# Patient Record
Sex: Female | Born: 1990 | Race: Black or African American | Hispanic: No | Marital: Single | Smoking: Former smoker
Health system: Southern US, Community
[De-identification: ages and names within clinical notes are randomized; demographics above are authoritative.]

## PROBLEM LIST (undated history)

## (undated) DIAGNOSIS — D571 Sickle-cell disease without crisis: Secondary | ICD-10-CM

## (undated) DIAGNOSIS — G8929 Other chronic pain: Secondary | ICD-10-CM

## (undated) DIAGNOSIS — M2550 Pain in unspecified joint: Secondary | ICD-10-CM

## (undated) DIAGNOSIS — M87059 Idiopathic aseptic necrosis of unspecified femur: Secondary | ICD-10-CM

## (undated) DIAGNOSIS — N6320 Unspecified lump in the left breast, unspecified quadrant: Secondary | ICD-10-CM

## (undated) DIAGNOSIS — Z9289 Personal history of other medical treatment: Secondary | ICD-10-CM

## (undated) HISTORY — PX: CHOLECYSTECTOMY: SHX55

---
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
## 2011-07-14 ENCOUNTER — Emergency Department (HOSPITAL_COMMUNITY)
Admission: EM | Admit: 2011-07-14 | Discharge: 2011-07-14 | Disposition: A | Discharge status: Home / Self Care | Payer: Managed Care, Other (non HMO) | Attending: Emergency Medicine | Admitting: Emergency Medicine

## 2011-07-14 ENCOUNTER — Encounter (HOSPITAL_COMMUNITY): Payer: Self-pay | Admitting: *Deleted

## 2011-07-14 ENCOUNTER — Emergency Department (HOSPITAL_COMMUNITY): Payer: Managed Care, Other (non HMO)

## 2011-07-14 DIAGNOSIS — M25561 Pain in right knee: Secondary | ICD-10-CM

## 2011-07-14 DIAGNOSIS — M25569 Pain in unspecified knee: Secondary | ICD-10-CM | POA: Insufficient documentation

## 2011-07-14 DIAGNOSIS — S93409A Sprain of unspecified ligament of unspecified ankle, initial encounter: Secondary | ICD-10-CM | POA: Insufficient documentation

## 2011-07-14 DIAGNOSIS — M8708 Idiopathic aseptic necrosis of bone, other site: Secondary | ICD-10-CM | POA: Insufficient documentation

## 2011-07-14 DIAGNOSIS — W010XXA Fall on same level from slipping, tripping and stumbling without subsequent striking against object, initial encounter: Secondary | ICD-10-CM | POA: Insufficient documentation

## 2011-07-14 DIAGNOSIS — S93401A Sprain of unspecified ligament of right ankle, initial encounter: Secondary | ICD-10-CM

## 2011-07-14 DIAGNOSIS — D571 Sickle-cell disease without crisis: Secondary | ICD-10-CM | POA: Insufficient documentation

## 2011-07-14 HISTORY — DX: Idiopathic aseptic necrosis of unspecified femur: M87.059

## 2011-07-14 HISTORY — DX: Sickle-cell disease without crisis: D57.1

## 2011-07-14 MED ORDER — NAPROXEN 500 MG PO TABS: 500.0000 mg | ORAL_TABLET | Freq: Two times a day (BID) | ORAL | Status: AC

## 2011-07-14 NOTE — ED Provider Notes (Signed)
History     CSN: [id number]  Arrival date & time 07/14/11  1430   First MD Initiated Contact with Patient 07/14/11 1505      Chief Complaint  Patient presents with  . Knee Pain    right  . Ankle Pain    right    (Consider location/radiation/quality/duration/timing/severity/associated sxs/prior treatment) Patient is a 21 y.o. female presenting with fall. The history is provided by the patient.  Fall The accident occurred more than 2 days ago. The fall occurred while walking. She landed on concrete. The point of impact was the right knee (right ankle). The pain is present in the right knee (right ankle). The pain is at a severity of 5/10. The pain is mild. Pertinent negatives include no fever and no numbness. She has tried ice, NSAIDs, acetaminophen and elevation for the symptoms. The treatment provided no relief.  PT states state tripped about a week ago and twisted her right ankle. States it has been swollen. She has been icing it, ACE wrapping, tylenol for pain, with no improvement. States about 3 days after the fall, she developed right knee pain. States painful to walk, move her knee, and even sleep, if she moves it the wrong way. No other injuries. Pt is able to bear weight.   Past Medical History  Diagnosis Date  . Sickle cell disease   . Avascular necrosis of bone of hip     Left  . Acute chest syndrome due to sickle-cell disease   . Blood transfusion     Past Surgical History  Procedure Date  . Wisdom teeth removal   . Cholecystectomy     History reviewed. No pertinent family history.  History  Substance Use Topics  . Smoking status: Never Smoker   . Smokeless tobacco: Never Used  . Alcohol Use: Yes     social    OB History    Grav Para Term Preterm Abortions TAB SAB Ect Mult Living                  Review of Systems  Constitutional: Negative for fever and chills.  Respiratory: Negative.   Cardiovascular: Negative.   Gastrointestinal: Negative.     Musculoskeletal: Positive for joint swelling and arthralgias.  Skin: Negative.   Neurological: Negative for weakness and numbness.    Allergies  Review of patient's allergies indicates no known allergies.  Home Medications   Current Outpatient Rx  Name Route Sig Dispense Refill  . ACETAMINOPHEN 500 MG PO TABS Oral Take 500 mg by mouth every 6 (six) hours as needed. for pain relief    . LORATADINE 10 MG PO TABS Oral Take 10 mg by mouth daily.    . ADULT MULTIVITAMIN W/MINERALS CH Oral Take 1 tablet by mouth daily.      BP 121/80  Pulse 82  Temp(Src) 97.8 F (36.6 C) (Oral)  Resp 16  Wt 110 lb (49.896 kg)  SpO2 100%  LMP 07/14/2011  Physical Exam  Nursing note and vitals reviewed. Constitutional: She is oriented to person, place, and time. She appears well-developed and well-nourished.  HENT:  Head: Normocephalic.  Eyes: Conjunctivae are normal.  Neck: Neck supple.  Cardiovascular: Normal rate, regular rhythm and normal heart sounds.   Pulmonary/Chest: Effort normal and breath sounds normal. No respiratory distress.  Musculoskeletal:       Right ankle appears swollen over lateral malleolus, tender to palpation over lateral malleolus. Pain with flexion, extension, inversion and eversion. Joint stable. Achillis tendon  intact. Right knee tender over medial joint space. No tenderness over patella, quadricept tendon, or posterior knee. Pt unable to straighten knee all the way, states It catches, and painful, Full flexion, however painful. Patella tendon intact  Neurological: She is alert and oriented to person, place, and time.  Skin: Skin is warm and dry.  Psychiatric: She has a normal mood and affect.    ED Course  Procedures (including critical care time)  Pt with right ankle pain and rightknee pain after a fall 1 week ago. Will get x-rays. No obvious deformity or neurovascular compromise on exam.   Dg Ankle Complete Right  07/14/2011  *RADIOLOGY REPORT*  Clinical Data:  Right ankle pain.  No reported injury.  RIGHT ANKLE - COMPLETE 3+ VIEW  Comparison: None.  Findings: Mild lateral soft tissue swelling.  Normal appearing bones.  No effusion.  IMPRESSION: Mild lateral soft tissue swelling.  Normal appearing bones.  Original Report Authenticated By: Darrol Angel, M.D.   Dg Knee Complete 4 Views Right  07/14/2011  *RADIOLOGY REPORT*  Clinical Data: Increased medial right knee pain following a fall 1 week ago.  RIGHT KNEE - COMPLETE 4+ VIEW  Comparison: None.  Findings: 2.4 cm nonaggressive lucent lesion with peripheral sclerosis in the metaphysis and metadiaphysis region of the distal right femur, medially.  No fracture, dislocation or effusion.  IMPRESSION:  1.  No fracture. 2.  Distal femoral non-ossifying fibroma.  Original Report Authenticated By: Darrol Angel, M.D.   X-rays are normal. Pt provided with an ASO brace and a knee sleeve. Joints are both stable. Will d/c home with Ortho follow up as needed.     1. Right ankle sprain   2. Pain in right knee       MDM          Lottie Mussel, PA 07/14/11 1658

## 2011-07-14 NOTE — ED Notes (Signed)
Pt from home with reports of tripping over a curb about a week ago injuring right ankle that was swollen and painful which improved but 3 days ago right knee began to hurt. Pt denies new injury since fall.

## 2011-07-14 NOTE — ED Notes (Signed)
Pt in with c/o right knee pain states fell several days ago states pain with ambulation

## 2011-07-14 NOTE — Discharge Instructions (Signed)
Your ankle x-ray is normal. Your knee xray showed a fibroma, which I do not think is connected to your pain, no fractures or other abnormalities seen. Keep ASO splint on your ankle when walking around. Keep knee sleeve on. Follow up with orthopedics if not improving or worsening. Take naprosyn for pain and inflammation as prescribed.   Ankle Sprain An ankle sprain is an injury to the strong, fibrous tissues (ligaments) that hold the bones of your ankle joint together.  CAUSES Ankle sprain usually is caused by a fall or by twisting your ankle. People who participate in sports are more prone to these types of injuries.  SYMPTOMS  Symptoms of ankle sprain include:  Pain in your ankle. The pain may be present at rest or only when you are trying to stand or walk.   Swelling.   Bruising. Bruising may develop immediately or within 1 to 2 days after your injury.   Difficulty standing or walking.  DIAGNOSIS  Your caregiver will ask you details about your injury and perform a physical exam of your ankle to determine if you have an ankle sprain. During the physical exam, your caregiver will press and squeeze specific areas of your foot and ankle. Your caregiver will try to move your ankle in certain ways. An X-ray exam may be done to be sure a bone was not broken or a ligament did not separate from one of the bones in your ankle (avulsion).  TREATMENT  Certain types of braces can help stabilize your ankle. Your caregiver can make a recommendation for this. Your caregiver may recommend the use of medication for pain. If your sprain is severe, your caregiver may refer you to a surgeon who helps to restore function to parts of your skeletal system (orthopedist) or a physical therapist. HOME CARE INSTRUCTIONS  Apply ice to your injury for 1 to 2 days or as directed by your caregiver. Applying ice helps to reduce inflammation and pain.  Put ice in a plastic bag.   Place a towel between your skin and the  bag.   Leave the ice on for 15 to 20 minutes at a time, every 2 hours while you are awake.   Take over-the-counter or prescription medicines for pain, discomfort, or fever only as directed by your caregiver.   Keep your injured leg elevated, when possible, to lessen swelling.   If your caregiver recommends crutches, use them as instructed. Gradually, put weight on the affected ankle. Continue to use crutches or a cane until you can walk without feeling pain in your ankle.   If you have a plaster splint, wear the splint as directed by your caregiver. Do not rest it on anything harder than a pillow the first 24 hours. Do not put weight on it. Do not get it wet. You may take it off to take a shower or bath.   You may have been given an elastic bandage to wear around your ankle to provide support. If the elastic bandage is too tight (you have numbness or tingling in your foot or your foot becomes cold and blue), adjust the bandage to make it comfortable.   If you have an air splint, you may blow more air into it or let air out to make it more comfortable. You may take your splint off at night and before taking a shower or bath.   Wiggle your toes in the splint several times per day if you are able.  SEEK MEDICAL CARE  IF:   You have an increase in bruising, swelling, or pain.   Your toes feel cold.   Pain relief is not achieved with medication.  SEEK IMMEDIATE MEDICAL CARE IF: Your toes are numb or blue or you have severe pain. MAKE SURE YOU:   Understand these instructions.   Will watch your condition.   Will get help right away if you are not doing well or get worse.  Document Released: 03/04/2005 Document Revised: 02/21/2011 Document Reviewed: 10/07/2007 [facility] Patient Information 2012 [place], [place].  Knee Pain The knee is the complex joint between your thigh and your lower leg. It is made up of bones, tendons, ligaments, and cartilage. The bones that make up the knee  are:  The femur in the thigh.   The tibia and fibula in the lower leg.   The patella or kneecap riding in the groove on the lower femur.  CAUSES  Knee pain is a common complaint with many causes. A few of these causes are:  Injury, such as:   A ruptured ligament or tendon injury.   Torn cartilage.   Medical conditions, such as:   Gout   Arthritis   Infections   Overuse, over training or overdoing a physical activity.  Knee pain can be minor or severe. Knee pain can accompany debilitating injury. Minor knee problems often respond well to self-care measures or get well on their own. More serious injuries may need medical intervention or even surgery. SYMPTOMS The knee is complex. Symptoms of knee problems can vary widely. Some of the problems are:  Pain with movement and weight bearing.   Swelling and tenderness.   Buckling of the knee.   Inability to straighten or extend your knee.   Your knee locks and you cannot straighten it.   Warmth and redness with pain and fever.   Deformity or dislocation of the kneecap.  DIAGNOSIS  Determining what is wrong may be very straight forward such as when there is an injury. It can also be challenging because of the complexity of the knee. Tests to make a diagnosis may include:  Your caregiver taking a history and doing a physical exam.   Routine X-rays can be used to rule out other problems. X-rays will not reveal a cartilage tear. Some injuries of the knee can be diagnosed by:   Arthroscopy a surgical technique by which a small video camera is inserted through tiny incisions on the sides of the knee. This procedure is used to examine and repair internal knee joint problems. Tiny instruments can be used during arthroscopy to repair the torn knee cartilage (meniscus).   Arthrography is a radiology technique. A contrast liquid is directly injected into the knee joint. Internal structures of the knee joint then become visible on  X-ray film.   An MRI scan is a non x-ray radiology procedure in which magnetic fields and a computer produce two- or three-dimensional images of the inside of the knee. Cartilage tears are often visible using an MRI scanner. MRI scans have largely replaced arthrography in diagnosing cartilage tears of the knee.   Blood work.   Examination of the fluid that helps to lubricate the knee joint (synovial fluid). This is done by taking a sample out using a needle and a syringe.  TREATMENT The treatment of knee problems depends on the cause. Some of these treatments are:  Depending on the injury, proper casting, splinting, surgery or physical therapy care will be needed.   Give yourself  adequate recovery time. Do not overuse your joints. If you begin to get sore during workout routines, back off. Slow down or do fewer repetitions.   For repetitive activities such as cycling or running, maintain your strength and nutrition.   Alternate muscle groups. For example if you are a weight lifter, work the upper body on one day and the lower body the next.   Either tight or weak muscles do not give the proper support for your knee. Tight or weak muscles do not absorb the stress placed on the knee joint. Keep the muscles surrounding the knee strong.   Take care of mechanical problems.   If you have flat feet, orthotics or special shoes may help. See your caregiver if you need help.   Arch supports, sometimes with wedges on the inner or outer aspect of the heel, can help. These can shift pressure away from the side of the knee most bothered by osteoarthritis.   A brace called an "unloader" brace also may be used to help ease the pressure on the most arthritic side of the knee.   If your caregiver has prescribed crutches, braces, wraps or ice, use as directed. The acronym for this is PRICE. This means protection, rest, ice, compression and elevation.   Nonsteroidal anti-inflammatory drugs (NSAID's), can  help relieve pain. But if taken immediately after an injury, they may actually increase swelling. Take NSAID's with food in your stomach. Stop them if you develop stomach problems. Do not take these if you have a history of ulcers, stomach pain or bleeding from the bowel. Do not take without your caregiver's approval if you have problems with fluid retention, heart failure, or kidney problems.   For ongoing knee problems, physical therapy may be helpful.   Glucosamine and chondroitin are over-the-counter dietary supplements. Both may help relieve the pain of osteoarthritis in the knee. These medicines are different from the usual anti-inflammatory drugs. Glucosamine may decrease the rate of cartilage destruction.   Injections of a corticosteroid drug into your knee joint may help reduce the symptoms of an arthritis flare-up. They may provide pain relief that lasts a few months. You may have to wait a few months between injections. The injections do have a small increased risk of infection, water retention and elevated blood sugar levels.   Hyaluronic acid injected into damaged joints may ease pain and provide lubrication. These injections may work by reducing inflammation. A series of shots may give relief for as long as 6 months.   Topical painkillers. Applying certain ointments to your skin may help relieve the pain and stiffness of osteoarthritis. Ask your pharmacist for suggestions. Many over the-counter products are approved for temporary relief of arthritis pain.   In some countries, doctors often prescribe topical NSAID's for relief of chronic conditions such as arthritis and tendinitis. A review of treatment with NSAID creams found that they worked as well as oral medications but without the serious side effects.  PREVENTION  Maintain a healthy weight. Extra pounds put more strain on your joints.   Get strong, stay limber. Weak muscles are a common cause of knee injuries. Stretching is  important. Include flexibility exercises in your workouts.   Be smart about exercise. If you have osteoarthritis, chronic knee pain or recurring injuries, you may need to change the way you exercise. This does not mean you have to stop being active. If your knees ache after jogging or playing basketball, consider switching to swimming, water aerobics or other  low-impact activities, at least for a few days a week. Sometimes limiting high-impact activities will provide relief.   Make sure your shoes fit well. Choose footwear that is right for your sport.   Protect your knees. Use the proper gear for knee-sensitive activities. Use kneepads when playing volleyball or laying carpet. Buckle your seat belt every time you drive. Most shattered kneecaps occur in car accidents.   Rest when you are tired.  SEEK MEDICAL CARE IF:  You have knee pain that is continual and does not seem to be getting better.  SEEK IMMEDIATE MEDICAL CARE IF:  Your knee joint feels hot to the touch and you have a high fever. MAKE SURE YOU:   Understand these instructions.   Will watch your condition.   Will get help right away if you are not doing well or get worse.  Document Released: 12/30/2006 Document Revised: 02/21/2011 Document Reviewed: 12/30/2006 [facility] Patient Information 2012 [place], [place].

## 2011-07-14 NOTE — ED Provider Notes (Signed)
Medical screening examination/treatment/procedure(s) were performed by non-physician practitioner and as supervising physician I was immediately available for consultation/collaboration.   Lyanne Co, MD 07/14/11 [phone]

## 2012-09-02 IMAGING — CR DG ANKLE COMPLETE 3+V*R*
3 series · 3 of 3 positions shown · non-contrast
Comparison: None.

CLINICAL DATA: Right ankle pain.  No reported injury.

RIGHT ANKLE - COMPLETE 3+ VIEW

[x ankle ap right]
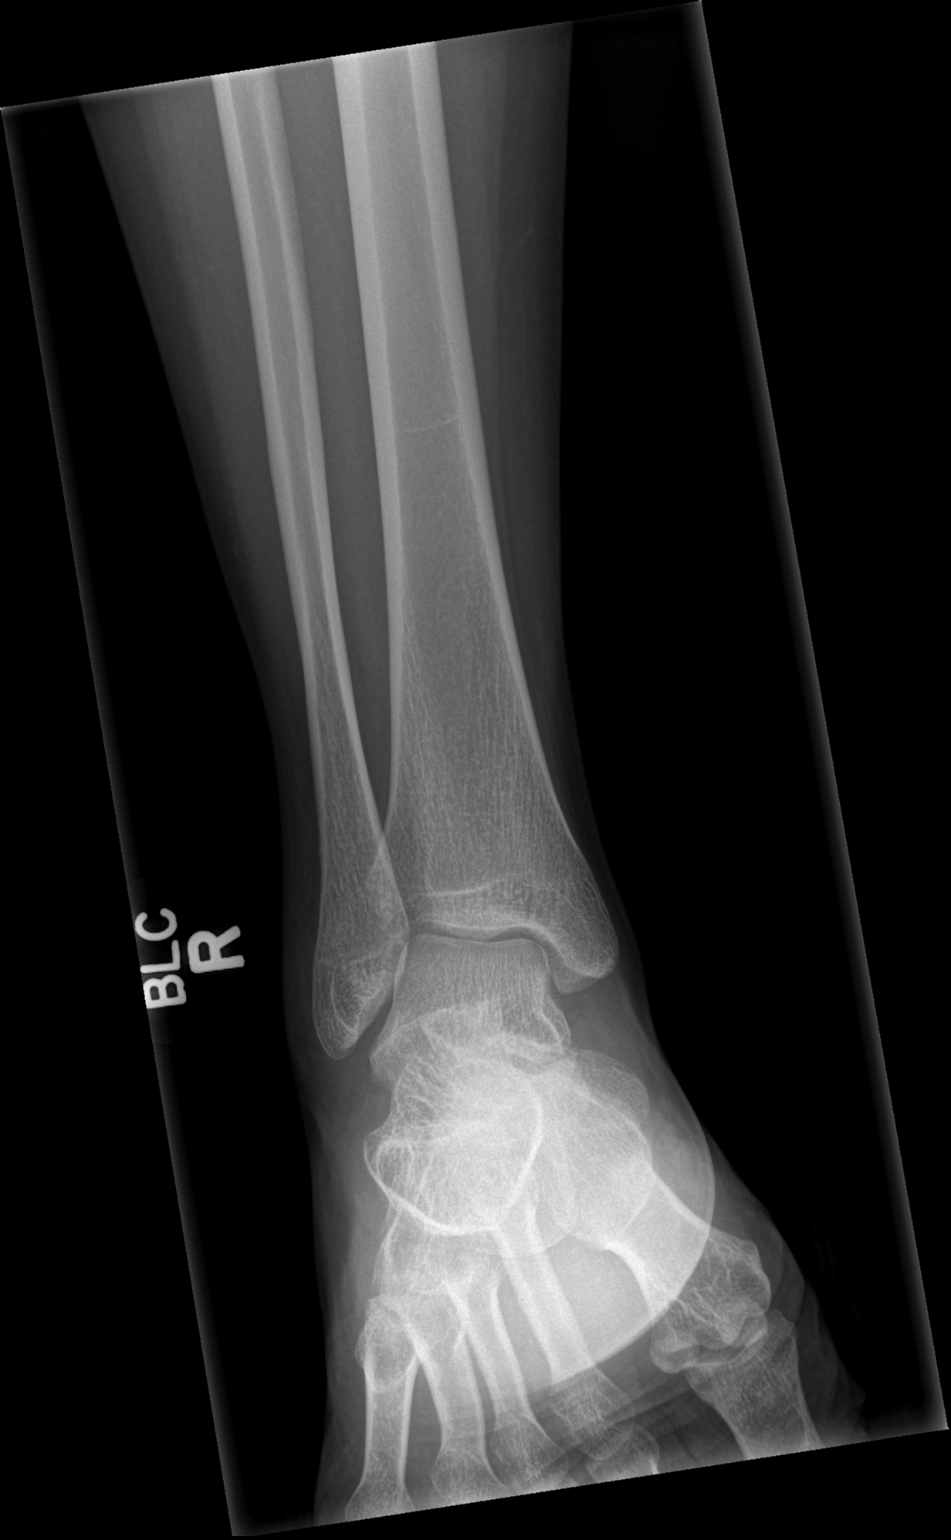

[x ankle obl right]
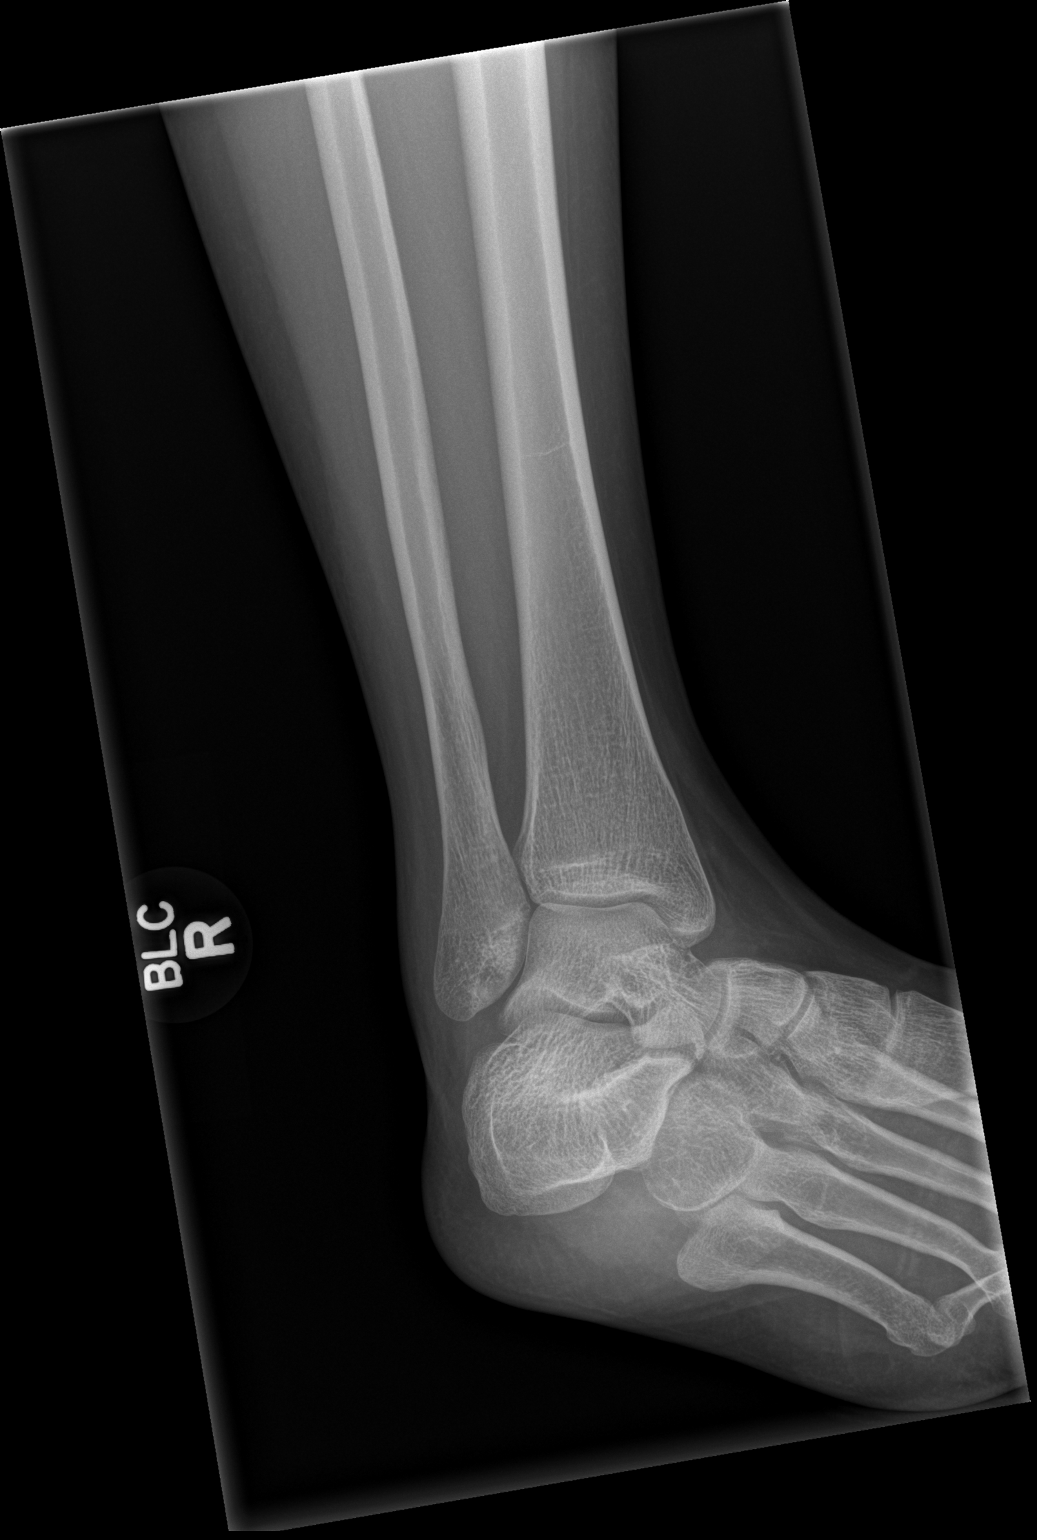

[x ankle lat right]
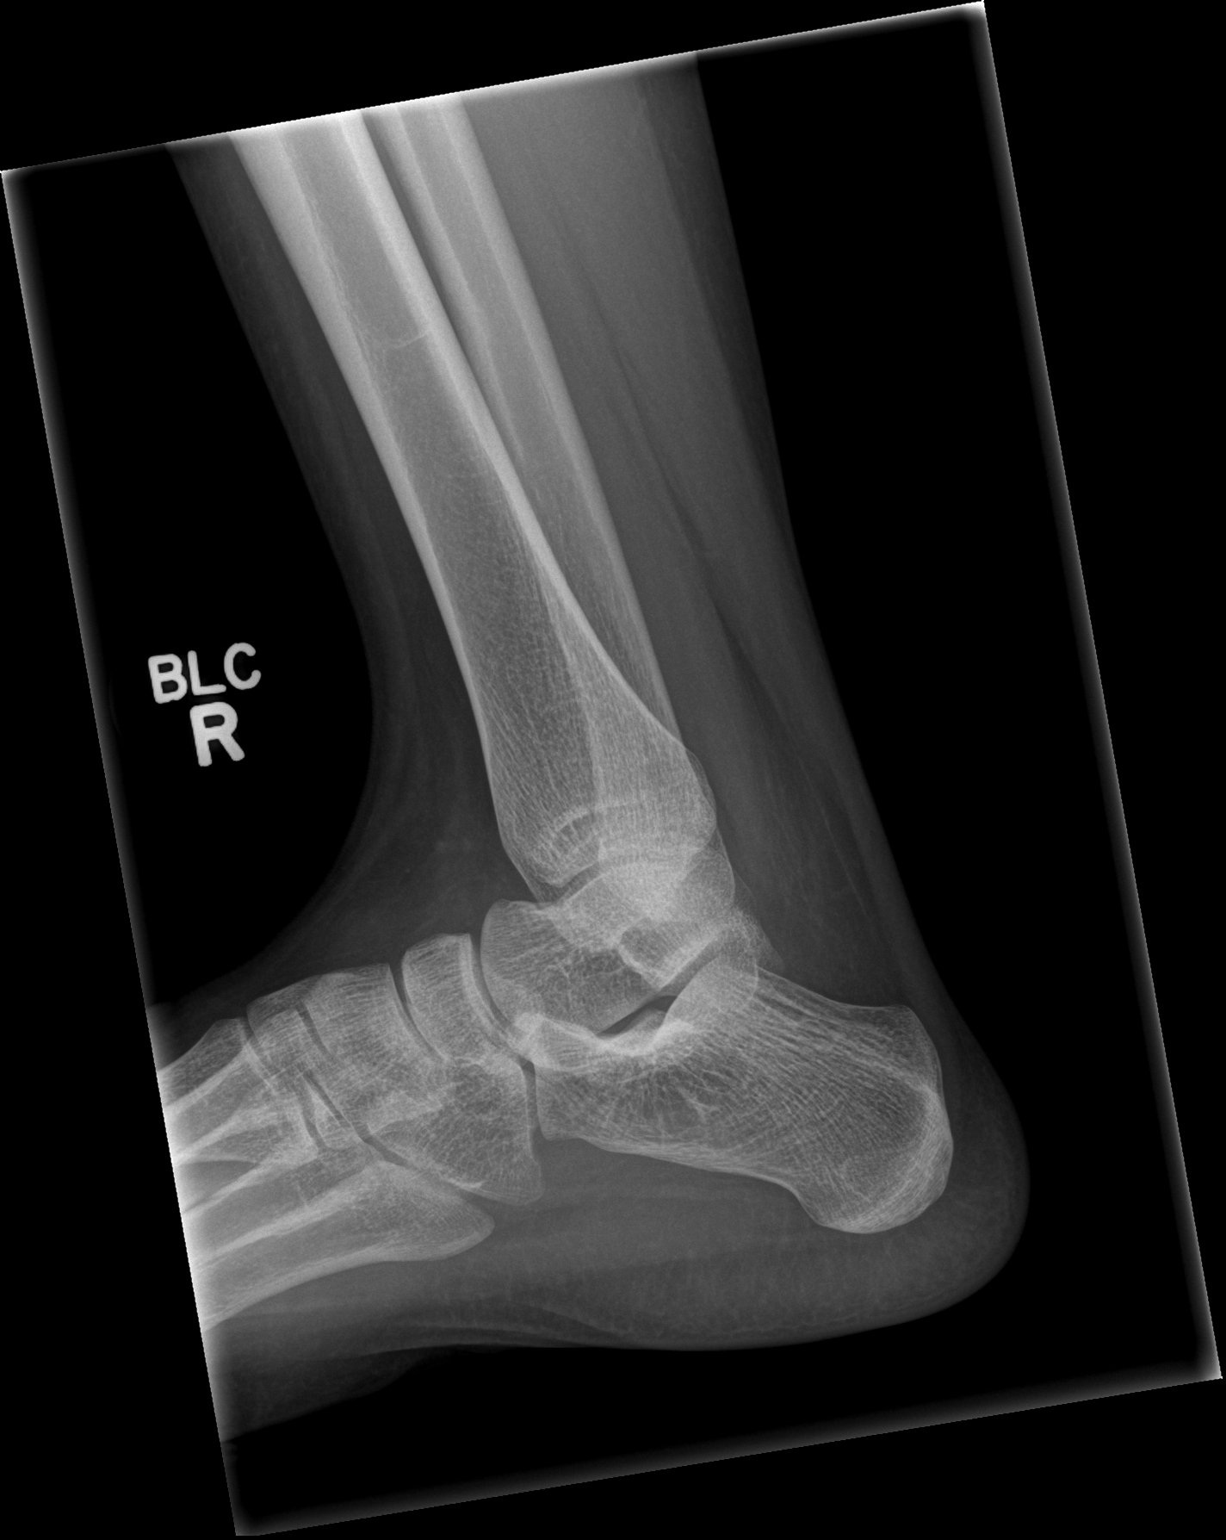

[3 of 3 positions shown; findings below may reference images not displayed]

FINDINGS: Mild lateral soft tissue swelling.  Normal appearing
bones.  No effusion.
IMPRESSION: Mild lateral soft tissue swelling.  Normal appearing bones.

## 2013-03-18 HISTORY — PX: VITRECTOMY: SHX106

## 2013-11-13 ENCOUNTER — Ambulatory Visit (INDEPENDENT_AMBULATORY_CARE_PROVIDER_SITE_OTHER): Payer: Self-pay | Admitting: Internal Medicine

## 2013-11-13 VITALS — BP 118/76 | HR 103 | Temp 98.2°F | Resp 16 | Ht 63.0 in | Wt 119.6 lb

## 2013-11-13 DIAGNOSIS — F410 Panic disorder [episodic paroxysmal anxiety] without agoraphobia: Secondary | ICD-10-CM

## 2013-11-13 MED ORDER — ALPRAZOLAM 0.25 MG PO TABS: 0.2500 mg | ORAL_TABLET | Freq: Two times a day (BID) | ORAL | Status: DC | PRN

## 2013-11-13 MED ORDER — ESCITALOPRAM OXALATE 10 MG PO TABS: 10.0000 mg | ORAL_TABLET | Freq: Every day | ORAL | Status: DC

## 2013-11-13 NOTE — Progress Notes (Signed)
Subjective:  This chart was scribed for Tami Lin, MD by Donato Schultz, Medical Scribe. This patient was seen in Room 5 and the patient's care was started at 10:15 AM.   Patient ID: Sabrina Short, female    DOB: 10-Feb-1991, 23 y.o.   MRN: [id number]  HPI HPI Comments: Sabrina Short is a 23 y.o. female who presents to the [facility] complaining of frequent panic attacks with associated chest pain that have progressively worsened over the past 2 years.  She first started experiencing panic attacks when she was a child but they eventually subsided.  They returned 7 years later when she was a senior in high school and has been suffering from them ever since.  She was evaluated for her symptoms when she was in elementary school and diagnosed her with acute chest syndrome.  Her panic attacks will last for 30-50 minutes depending on their severity.  She has had more than one panic attack over the past couple of weeks.  She lists palpitations and SOB as associated symptoms.  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  She states that schools stresses her out but does not cause a panic attack.  There is nothing in her social life that is causing her stress.  She has trouble falling asleep and will think about things she has to do the next day.  She does not have any problems with cleanliness, fears, or obsessive thoughts.  She has not had any significant weight loss within the last 3 months but when she has a crisis she will lose a little bit of weight.  She gets depressed at the thought of having another panic attack but it does not interfere with her ability to perform in school.  She has been treated with Zoloft but stopped taking the medication because it made her feel on edge.  She has never used any medication to immediately treat her symptoms.  She tends to stay away from romantic relationships to focus on  herself.  She does not consume much alcohol and has never used marijuana or cocaine.    She has a history of sickle cell disease and her last crisis was on August 3.  She will self-medicate with Ibuprofen if it is a small crisis but if it is a more severe one she will use Ibuprofen and Hydrocodone every 6 hours.    Her grandfather has a history of depression but nobody else in her family has a history of anxiety attacks.     Past Medical History  Diagnosis Date  . Sickle cell disease   . Avascular necrosis of bone of hip     Left  . Acute chest syndrome due to sickle-cell disease   . Blood transfusion    Past Surgical History  Procedure Laterality Date  . Wisdom teeth removal    . Cholecystectomy     History reviewed. No pertinent family history. History   Social History  . Marital Status: Single    Spouse Name: N/A    Number of Children: N/A  . Years of Education: N/A   Occupational History  . Not on file.   Social History Main Topics  . Smoking status: Never Smoker   . Smokeless tobacco: Never Used  . Alcohol Use: Yes     Comment: social  . Drug Use: Yes    Special: Marijuana  Comment: occ  . Sexual Activity:    Other Topics Concern  . Not on file   Social History Narrative  . No narrative on file   Allergies  Allergen Reactions  . Morphine And Related     Review of Systems  Cardiovascular: Positive for chest pain and palpitations.   no headaches No confusion No weight loss   Objective:  Physical Exam  Nursing note and vitals reviewed. Constitutional: She is oriented to person, place, and time. She appears well-developed and well-nourished. No distress.  HENT:  Head: Normocephalic and atraumatic.  Eyes: Conjunctivae and EOM are normal. Pupils are equal, round, and reactive to light.  Neck: Neck supple. Carotid bruit is not present. No thyromegaly present.  Cardiovascular: Regular rhythm and normal heart sounds.  Bradycardia present.  Exam  reveals no gallop and no friction rub.   No murmur heard. Pulmonary/Chest: Effort normal and breath sounds normal. No respiratory distress. She has no wheezes. She has no rales.  Lymphadenopathy:    She has no cervical adenopathy.  Neurological: She is alert and oriented to person, place, and time. No cranial nerve deficit.  Psychiatric: She has a normal mood and affect. Her behavior is normal.     BP 118/76  Pulse 103  Temp(Src) 98.2 F (36.8 C) (Oral)  Resp 16  Ht 5\' 3"  (1.6 m)  Wt 119 lb 9.6 oz (54.25 kg)  BMI 21.19 kg/m2  SpO2 100%  LMP 10/18/2013 Assessment & Plan:  GAD--- start Lexapro/use Xanax for panic attacks for now/followup 3 weeks appointment  Meds ordered this encounter  Medications  . ALPRAZolam (XANAX) 0.25 MG tablet    Sig: Take 1 tablet (0.25 mg total) by mouth 2 (two) times daily as needed for anxiety.    Dispense:  60 tablet    Refill:  0  . escitalopram (LEXAPRO) 10 MG tablet    Sig: Take 1 tablet (10 mg total) by mouth daily.    Dispense:  30 tablet    Refill:  0    I personally performed the services described in this documentation, which was scribed in my presence. The recorded information has been reviewed and is accurate.

## 2013-11-15 DIAGNOSIS — F410 Panic disorder [episodic paroxysmal anxiety] without agoraphobia: Secondary | ICD-10-CM | POA: Insufficient documentation

## 2014-12-20 ENCOUNTER — Encounter: Payer: Self-pay | Admitting: Family Medicine

## 2014-12-20 ENCOUNTER — Ambulatory Visit (INDEPENDENT_AMBULATORY_CARE_PROVIDER_SITE_OTHER): Payer: Managed Care, Other (non HMO) | Admitting: Family Medicine

## 2014-12-20 VITALS — BP 113/82 | HR 100 | Temp 98.7°F | Resp 14 | Ht 63.0 in | Wt 117.0 lb

## 2014-12-20 DIAGNOSIS — M25512 Pain in left shoulder: Secondary | ICD-10-CM | POA: Diagnosis not present

## 2014-12-20 DIAGNOSIS — M8700 Idiopathic aseptic necrosis of unspecified bone: Secondary | ICD-10-CM | POA: Insufficient documentation

## 2014-12-20 DIAGNOSIS — D571 Sickle-cell disease without crisis: Secondary | ICD-10-CM | POA: Diagnosis not present

## 2014-12-20 LAB — COMPLETE METABOLIC PANEL WITH GFR
ALBUMIN: 4.7 g/dL (ref 3.6–5.1)
ALK PHOS: 49 U/L (ref 33–115)
ALT: 15 U/L (ref 6–29)
AST: 22 U/L (ref 10–30)
BILIRUBIN TOTAL: 1.1 mg/dL (ref 0.2–1.2)
BUN: 7 mg/dL (ref 7–25)
CALCIUM: 10.2 mg/dL (ref 8.6–10.2)
CO2: 27 mmol/L (ref 20–31)
CREATININE: 0.65 mg/dL (ref 0.50–1.10)
Chloride: 101 mmol/L (ref 98–110)
Glucose, Bld: 77 mg/dL (ref 65–99)
Potassium: 4.4 mmol/L (ref 3.5–5.3)
Sodium: 137 mmol/L (ref 135–146)
TOTAL PROTEIN: 8.1 g/dL (ref 6.1–8.1)

## 2014-12-20 LAB — POCT URINALYSIS DIP (DEVICE)
Bilirubin Urine: NEGATIVE
GLUCOSE, UA: NEGATIVE mg/dL
Hgb urine dipstick: NEGATIVE
Ketones, ur: NEGATIVE mg/dL
LEUKOCYTES UA: NEGATIVE
NITRITE: NEGATIVE
PROTEIN: NEGATIVE mg/dL
SPECIFIC GRAVITY, URINE: 1.02 (ref 1.005–1.030)
UROBILINOGEN UA: 0.2 mg/dL (ref 0.0–1.0)
pH: 7 (ref 5.0–8.0)

## 2014-12-20 LAB — CBC WITH DIFFERENTIAL/PLATELET
BASOS ABS: 0.1 10*3/uL (ref 0.0–0.1)
Basophils Relative: 1 % (ref 0–1)
EOS ABS: 0.1 10*3/uL (ref 0.0–0.7)
Eosinophils Relative: 1 % (ref 0–5)
HEMATOCRIT: 35.7 % — AB (ref 36.0–46.0)
HEMOGLOBIN: 12 g/dL (ref 12.0–15.0)
LYMPHS ABS: 1.7 10*3/uL (ref 0.7–4.0)
LYMPHS PCT: 27 % (ref 12–46)
MCH: 25.8 pg — AB (ref 26.0–34.0)
MCHC: 33.6 g/dL (ref 30.0–36.0)
MCV: 76.6 fL — AB (ref 78.0–100.0)
MONOS PCT: 18 % — AB (ref 3–12)
MPV: 8.9 fL (ref 8.6–12.4)
Monocytes Absolute: 1.1 10*3/uL — ABNORMAL HIGH (ref 0.1–1.0)
NEUTROS PCT: 53 % (ref 43–77)
Neutro Abs: 3.3 10*3/uL (ref 1.7–7.7)
PLATELETS: 404 10*3/uL — AB (ref 150–400)
RBC: 4.66 MIL/uL (ref 3.87–5.11)
RDW: 18.2 % — ABNORMAL HIGH (ref 11.5–15.5)
WBC: 6.3 10*3/uL (ref 4.0–10.5)

## 2014-12-20 LAB — LACTATE DEHYDROGENASE: LDH: 143 U/L (ref 94–250)

## 2014-12-20 LAB — FERRITIN: FERRITIN: 55 ng/mL (ref 10–291)

## 2014-12-20 MED ORDER — FOLIC ACID 1 MG PO TABS: 1.0000 mg | ORAL_TABLET | Freq: Every day | ORAL | Status: AC

## 2014-12-20 MED ORDER — IBUPROFEN 600 MG PO TABS: 600.0000 mg | ORAL_TABLET | Freq: Four times a day (QID) | ORAL | Status: DC | PRN

## 2014-12-20 MED ORDER — OXYCODONE HCL 5 MG PO TABS: 5.0000 mg | ORAL_TABLET | ORAL | Status: DC | PRN

## 2014-12-20 NOTE — Progress Notes (Signed)
Subjective:    Patient ID: Sabrina Short, female    DOB: 03-02-1991, 24 y.o.   MRN: [id number]  HPI Sabrina Short, a student with a history of sickle cell anemia, HbSC. She is currently a Ship broker at the [employer] and states that she has been followed by [facility]. She states that it has been difficult to go to follow-up appointments. She is currently complaining of left shoulder pain. She states that pain intensity is currently 3-4/10 described as intermittent and aching. She states that pain is increased by activity. She states that she has been taking Tylenol 500 mg as needed since running out of Oxycodone 5 mg 1 week ago. She states that she only uses opiate medications as a last resort. She has not had a prescription since July 2016.   Past Medical History  Diagnosis Date  . Sickle cell disease ([place])   . 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 Acute chest syndrome due to sickle-cell disease ([place])   . Blood transfusion    There is no immunization history on file for this patient.  Allergies  Allergen Reactions  . Morphine And Related    Social History   Social History  . Marital Status: Single    Spouse Name: N/A  . Number of Children: N/A  . Years of Education: N/A   Occupational History  . Not on file.   Social History Main Topics  . Smoking status: Former Research scientist (life sciences)  . Smokeless tobacco: Never Used  . Alcohol Use: Yes     Comment: social  . Drug Use: No     Comment: occ  . Sexual Activity: Not on file   Other Topics Concern  . Not on file   Social History Narrative   Review of Systems  Constitutional: Negative.   HENT: Negative.   Eyes: Negative.   Respiratory: Negative.   Cardiovascular: Negative.   Gastrointestinal: Negative.   Endocrine: Negative for polydipsia, polyphagia and polyuria.  Genitourinary: Negative.   Musculoskeletal: Positive for myalgias (left shoulder pain).  Skin: Negative.   Allergic/Immunologic:  Negative for environmental allergies and food allergies.  Neurological: Negative.  Negative for dizziness and headaches.  Hematological: Negative.   Psychiatric/Behavioral: Negative.        Objective:   Physical Exam  Constitutional: She is oriented to person, place, and time. She appears well-developed and well-nourished.  HENT:  Head: Normocephalic and atraumatic.  Right Ear: External ear normal.  Left Ear: External ear normal.  Mouth/Throat: Oropharynx is clear and moist.  Eyes: Conjunctivae and EOM are normal. Pupils are equal, round, and reactive to light.  Cardiovascular: Normal rate, regular rhythm, normal heart sounds and intact distal pulses.   Pulmonary/Chest: Effort normal and breath sounds normal.  Abdominal: Soft. Bowel sounds are normal.  Musculoskeletal: Normal range of motion.  Neurological: She is alert and oriented to person, place, and time. She has normal reflexes.  Skin: Skin is warm and dry.  Psychiatric: She has a normal mood and affect. Her behavior is normal. Judgment and thought content normal.      BP 113/82 mmHg  Pulse 100  Temp(Src) 98.7 F (37.1 C) (Oral)  Resp 14  Ht 5\' 3"  (1.6 m)  Wt 117 lb (53.071 kg)  BMI 20.73 kg/m2  LMP  Assessment & Plan:  1. Hb-SS disease without crisis ([place]) Sickle cell disease - The patient was reminded of the need to seek medical attention of  any symptoms of bleeding, anemia, or infection. Continue folic acid 1 mg daily to prevent aplastic bone marrow crises.    Pulmonary evaluation - Patient denies severe recurrent wheezes, shortness of breath with exercise, or persistent cough. If these symptoms develop, pulmonary function tests with spirometry will be ordered, and if abnormal, plan on referral to Pulmonology for further evaluation.  Cardiac - Routine screening for pulmonary hypertension is not recommended.  Eye - High risk of proliferative retinopathy. Annual eye exam with retinal exam recommended to patient. Last  eye examination was 6 months ago  Immunization status - She declines vaccines today. Patient states that she cannot tolerate influenza vaccination because it triggers sickle cell crisis. She states that she will bring vaccination records to follow-up appointment.   Acute and chronic painful episodes - We agreed on Oxycodone 5 mg every 4 hours for severe pain. We discussed that pt is to receive her Schedule II prescriptions only from [place]. Pt is also aware that the prescription history is available to [place] online through the [facility] CSRS. Controlled substance agreement signed 12/20/2014. We reminded Ms. Baldonado that all patients receiving Schedule II narcotics must be seen for follow within one month of prescription being requested. We reviewed the terms of our pain agreement, including the need to keep medicines in a safe locked location away from children or pets, and the need to report excess sedation or constipation, measures to avoid constipation, and policies related to early refills and stolen prescriptions. According to the [place] Chronic Pain Initiative program, we have reviewed details related to analgesia, adverse effects, aberrant behaviors. Reviewed [place] Substance Reporting system prior to reorder.     Iron overload from chronic transfusion.  Will check ferritin level   Vitamin D deficiency - Drisdol 50,000 units weekly, we encouraged her to take it.  - folic acid (FOLVITE) 1 MG tablet; Take 1 tablet (1 mg total) by mouth daily.  Dispense: 30 tablet; Refill: 11 - oxyCODONE (OXY IR/ROXICODONE) 5 MG immediate release tablet; Take 1 tablet (5 mg total) by mouth every 4 (four) hours as needed for severe pain.  Dispense: 30 tablet; Refill: 0 - ibuprofen (ADVIL,MOTRIN) 600 MG tablet; Take 1 tablet (600 mg total) by mouth every 6 (six) hours as needed.  Dispense: 30 tablet; Refill: 1 - EKG 12-Lead - Lactate Dehydrogenase - CBC with Differential - COMPLETE METABOLIC PANEL WITH GFR - Ferritin - POCT urinalysis  dipstick  2. Left shoulder pain  - DG Shoulder Left; Future   RTC: 1 month

## 2014-12-20 NOTE — Patient Instructions (Signed)
Sickle Cell Anemia, Adult °Sickle cell anemia is a condition in which red blood cells have an abnormal "sickle" shape. This abnormal shape shortens the cells' life span, which results in a lower than normal concentration of red blood cells in the blood. The sickle shape also causes the cells to clump together and block free blood flow through the blood vessels. As a result, the tissues and organs of the body do not receive enough oxygen. Sickle cell anemia causes organ damage and pain and increases the risk of infection. °CAUSES  °Sickle cell anemia is a genetic disorder. Those who receive two copies of the gene have the condition, and those who receive one copy have the trait. °RISK FACTORS °The sickle cell gene is most common in people whose families originated in [place]. Other areas of the globe where sickle cell trait occurs include the Mediterranean, South and [place], the [place], and the [place].  °SIGNS AND SYMPTOMS °· Pain, especially in the extremities, back, chest, or abdomen (common). The pain may start suddenly or may develop following an illness, especially if there is dehydration. Pain can also occur due to overexertion or exposure to extreme temperature changes. °· Frequent severe bacterial infections, especially certain types of pneumonia and meningitis. °· Pain and swelling in the hands and feet. °· Decreased activity.   °· Loss of appetite.   °· Change in behavior. °· Headaches. °· Seizures. °· Shortness of breath or difficulty breathing. °· Vision changes. °· Skin ulcers. °Those with the trait may not have symptoms or they may have mild symptoms.  °DIAGNOSIS  °Sickle cell anemia is diagnosed with blood tests that demonstrate the genetic trait. It is often diagnosed during the newborn period, due to mandatory testing nationwide. A variety of blood tests, X-rays, CT scans, MRI scans, ultrasounds, and lung function tests may also be done to monitor the condition. °TREATMENT  °Sickle  cell anemia may be treated with: °· Medicines. You may be given pain medicines, antibiotic medicines (to treat and prevent infections) or medicines to increase the production of certain types of hemoglobin. °· Fluids. °· Oxygen. °· Blood transfusions. °HOME CARE INSTRUCTIONS  °· Drink enough fluid to keep your urine clear or pale yellow. Increase your fluid intake in hot weather and during exercise. °· Do not smoke. Smoking lowers oxygen levels in the blood.   °· Only take over-the-counter or prescription medicines for pain, fever, or discomfort as directed by your health care provider. °· Take antibiotics as directed by your health care provider. Make sure you finish them it even if you start to feel better.   °· Take supplements as directed by your health care provider.   °· Consider wearing a medical alert bracelet. This tells anyone caring for you in an emergency of your condition.   °· When traveling, keep your medical information, health care provider's names, and the medicines you take with you at all times.   °· If you develop a fever, do not take medicines to reduce the fever right away. This could cover up a problem that is developing. Notify your health care provider. °· Keep all follow-up appointments with your health care provider. Sickle cell anemia requires regular medical care. °SEEK MEDICAL CARE IF: ° You have a fever. °SEEK IMMEDIATE MEDICAL CARE IF:  °· You feel dizzy or faint.   °· You have new abdominal pain, especially on the left side near the stomach area.   °· You develop a persistent, often uncomfortable and painful penile erection (priapism). If this is not treated immediately it   will lead to impotence.   °· You have numbness your arms or legs or you have a hard time moving them.   °· You have a hard time with speech.   °· You have a fever or persistent symptoms for more than 2-3 days.   °· You have a fever and your symptoms suddenly get worse.   °· You have signs or symptoms of infection.  These include:   °¨ Chills.   °¨ Abnormal tiredness (lethargy).   °¨ Irritability.   °¨ Poor eating.   °¨ Vomiting.   °· You develop pain that is not helped with medicine.   °· You develop shortness of breath. °· You have pain in your chest.   °· You are coughing up pus-like or bloody sputum.   °· You develop a stiff neck. °· Your feet or hands swell or have pain. °· Your abdomen appears bloated. °· You develop joint pain. °MAKE SURE YOU: °· Understand these instructions. °Document Released: 06/12/2005 Document Revised: 07/19/2013 Document Reviewed: 10/14/2012 °ExitCare® Patient Information ©2015 [employer]. This information is not intended to replace advice given to you by your health care provider. Make sure you discuss any questions you have with your health care provider. ° °

## 2014-12-21 ENCOUNTER — Other Ambulatory Visit: Payer: Self-pay | Admitting: Family Medicine

## 2014-12-21 DIAGNOSIS — E559 Vitamin D deficiency, unspecified: Secondary | ICD-10-CM

## 2014-12-21 LAB — VITAMIN D 25 HYDROXY (VIT D DEFICIENCY, FRACTURES): Vit D, 25-Hydroxy: 15 ng/mL — ABNORMAL LOW (ref 30–100)

## 2014-12-21 MED ORDER — ERGOCALCIFEROL 1.25 MG (50000 UT) PO CAPS: 50000.0000 [IU] | ORAL_CAPSULE | ORAL | Status: AC

## 2014-12-24 ENCOUNTER — Ambulatory Visit (HOSPITAL_COMMUNITY)
Admission: RE | Admit: 2014-12-24 | Discharge: 2014-12-24 | Disposition: A | Discharge status: Home / Self Care | Payer: Managed Care, Other (non HMO) | Source: Ambulatory Visit | Attending: Family Medicine | Admitting: Family Medicine

## 2014-12-24 DIAGNOSIS — R937 Abnormal findings on diagnostic imaging of other parts of musculoskeletal system: Secondary | ICD-10-CM | POA: Insufficient documentation

## 2014-12-24 DIAGNOSIS — M25512 Pain in left shoulder: Secondary | ICD-10-CM | POA: Insufficient documentation

## 2014-12-24 DIAGNOSIS — G8929 Other chronic pain: Secondary | ICD-10-CM | POA: Diagnosis not present

## 2014-12-29 ENCOUNTER — Other Ambulatory Visit: Payer: Self-pay | Admitting: Family Medicine

## 2014-12-29 DIAGNOSIS — D571 Sickle-cell disease without crisis: Secondary | ICD-10-CM

## 2014-12-29 DIAGNOSIS — M25512 Pain in left shoulder: Secondary | ICD-10-CM

## 2015-01-02 ENCOUNTER — Other Ambulatory Visit: Payer: Self-pay | Admitting: Family Medicine

## 2015-01-02 ENCOUNTER — Encounter (HOSPITAL_COMMUNITY): Payer: Self-pay | Admitting: *Deleted

## 2015-01-02 ENCOUNTER — Telehealth (HOSPITAL_COMMUNITY): Payer: Self-pay | Admitting: *Deleted

## 2015-01-02 ENCOUNTER — Non-Acute Institutional Stay (HOSPITAL_COMMUNITY)
Admission: AD | Admit: 2015-01-02 | Discharge: 2015-01-02 | Disposition: A | Discharge status: Home / Self Care | Payer: Managed Care, Other (non HMO) | Source: Ambulatory Visit | Attending: Internal Medicine | Admitting: Internal Medicine

## 2015-01-02 DIAGNOSIS — Z79899 Other long term (current) drug therapy: Secondary | ICD-10-CM | POA: Diagnosis not present

## 2015-01-02 DIAGNOSIS — Z791 Long term (current) use of non-steroidal anti-inflammatories (NSAID): Secondary | ICD-10-CM | POA: Diagnosis not present

## 2015-01-02 DIAGNOSIS — R110 Nausea: Secondary | ICD-10-CM

## 2015-01-02 DIAGNOSIS — M79602 Pain in left arm: Secondary | ICD-10-CM | POA: Diagnosis present

## 2015-01-02 DIAGNOSIS — Z87891 Personal history of nicotine dependence: Secondary | ICD-10-CM | POA: Insufficient documentation

## 2015-01-02 DIAGNOSIS — Z79891 Long term (current) use of opiate analgesic: Secondary | ICD-10-CM | POA: Diagnosis not present

## 2015-01-02 DIAGNOSIS — D5700 Hb-SS disease with crisis, unspecified: Secondary | ICD-10-CM | POA: Diagnosis not present

## 2015-01-02 LAB — COMPREHENSIVE METABOLIC PANEL
ALBUMIN: 4.6 g/dL (ref 3.5–5.0)
ALK PHOS: 43 U/L (ref 38–126)
ALT: 12 U/L — AB (ref 14–54)
AST: 22 U/L (ref 15–41)
Anion gap: 6 (ref 5–15)
BUN: 9 mg/dL (ref 6–20)
CALCIUM: 9.4 mg/dL (ref 8.9–10.3)
CO2: 28 mmol/L (ref 22–32)
CREATININE: 0.63 mg/dL (ref 0.44–1.00)
Chloride: 107 mmol/L (ref 101–111)
GFR calc Af Amer: >60 mL/min (ref >60)
GFR calc non Af Amer: >60 mL/min (ref >60)
GLUCOSE: 122 mg/dL — AB (ref 65–99)
Potassium: 4.4 mmol/L (ref 3.5–5.1)
SODIUM: 141 mmol/L (ref 135–145)
Total Bilirubin: 1.2 mg/dL (ref 0.3–1.2)
Total Protein: 8 g/dL (ref 6.5–8.1)

## 2015-01-02 LAB — CBC WITH DIFFERENTIAL/PLATELET
BASOS ABS: 0 10*3/uL (ref 0.0–0.1)
Basophils Relative: 0 %
Eosinophils Absolute: 0.1 10*3/uL (ref 0.0–0.7)
Eosinophils Relative: 1 %
HEMATOCRIT: 30.7 % — AB (ref 36.0–46.0)
HEMOGLOBIN: 10.9 g/dL — AB (ref 12.0–15.0)
LYMPHS ABS: 2.9 10*3/uL (ref 0.7–4.0)
LYMPHS PCT: 35 %
MCH: 25.7 pg — AB (ref 26.0–34.0)
MCHC: 35.5 g/dL (ref 30.0–36.0)
MCV: 72.4 fL — ABNORMAL LOW (ref 78.0–100.0)
Monocytes Absolute: 0.9 10*3/uL (ref 0.1–1.0)
Monocytes Relative: 11 %
NEUTROS ABS: 4.2 10*3/uL (ref 1.7–7.7)
NEUTROS PCT: 53 %
Platelets: 533 10*3/uL — ABNORMAL HIGH (ref 150–400)
RBC: 4.24 MIL/uL (ref 3.87–5.11)
RDW: 17.5 % — ABNORMAL HIGH (ref 11.5–15.5)
WBC: 8.1 10*3/uL (ref 4.0–10.5)

## 2015-01-02 LAB — RETICULOCYTES
RBC.: 4.24 MIL/uL (ref 3.87–5.11)
RETIC COUNT ABSOLUTE: 165.4 10*3/uL (ref 19.0–186.0)
Retic Ct Pct: 3.9 % — ABNORMAL HIGH (ref 0.4–3.1)

## 2015-01-02 LAB — LACTATE DEHYDROGENASE: LDH: 136 U/L (ref 98–192)

## 2015-01-02 MED ORDER — DEXTROSE-NACL 5-0.45 % IV SOLN
INTRAVENOUS | Status: DC
  Administered 2015-01-02: 13:00:00 via INTRAVENOUS

## 2015-01-02 MED ORDER — PROMETHAZINE HCL 25 MG/ML IJ SOLN
6.2500 mg | Freq: Once | INTRAMUSCULAR | Status: AC
  Administered 2015-01-02: 6.25 mg via INTRAVENOUS
  Filled 2015-01-02: qty 1

## 2015-01-02 MED ORDER — HYDROMORPHONE HCL 2 MG/ML IJ SOLN
2.0000 mg | Freq: Once | INTRAMUSCULAR | Status: AC
  Administered 2015-01-02: 2 mg via INTRAVENOUS
  Filled 2015-01-02: qty 1

## 2015-01-02 MED ORDER — KETOROLAC TROMETHAMINE 30 MG/ML IJ SOLN
INTRAMUSCULAR | Status: AC
  Administered 2015-01-02: 30 mg via INTRAVENOUS
  Filled 2015-01-02: qty 1

## 2015-01-02 MED ORDER — ONDANSETRON HCL 4 MG/2ML IJ SOLN
4.0000 mg | Freq: Once | INTRAMUSCULAR | Status: AC
  Administered 2015-01-02: 4 mg via INTRAVENOUS
  Filled 2015-01-02: qty 2

## 2015-01-02 MED ORDER — KETOROLAC TROMETHAMINE 30 MG/ML IJ SOLN
30.0000 mg | Freq: Once | INTRAMUSCULAR | Status: AC
  Administered 2015-01-02: 30 mg via INTRAVENOUS

## 2015-01-02 MED ORDER — ONDANSETRON HCL 4 MG PO TABS: 4.0000 mg | ORAL_TABLET | Freq: Three times a day (TID) | ORAL | Status: DC | PRN

## 2015-01-02 MED ORDER — HYDROMORPHONE HCL 2 MG/ML IJ SOLN
1.0000 mg | Freq: Once | INTRAMUSCULAR | Status: AC
  Administered 2015-01-02: 1 mg via INTRAVENOUS

## 2015-01-02 MED ORDER — HYDROMORPHONE HCL 2 MG/ML IJ SOLN
INTRAMUSCULAR | Status: AC
  Administered 2015-01-02: 1 mg via INTRAVENOUS
  Filled 2015-01-02: qty 1

## 2015-01-02 NOTE — Progress Notes (Signed)
Pt received to the [facility] for pain relief. Pt was given IV fluids and IV pushes of Dilaudid, Toradol, and Zofran. Pt tolerated treatment. Pt's pain # was 8 on admission and 4 at discharge. Pt reminded to cont with her pain meds as prescribe and hydrate as much as possible. Pt voiced understanding and discharged to home.

## 2015-01-02 NOTE — Telephone Encounter (Signed)
Notified patient after speaking with provider. Pt can come to the Day hospital, but states it would take an hour for her to get here. Will check with provider and call her back. Pt voiced understanding.

## 2015-01-02 NOTE — Telephone Encounter (Signed)
Called patient back. She can get here in 30 mins. Pt agreed to come [facility].

## 2015-01-02 NOTE — Discharge Summary (Signed)
[facility] Discharge Summary   Patient ID: Trenika Hudson MRN: [id number] DOB/AGE: Apr 08, 1990 24 y.o.  Admit date: 01/02/2015 Discharge date: 01/02/2015  Primary Care Physician:  No PCP Per Patient  Admission Diagnoses:  Active Problems:   Sickle cell anemia with crisis [facility])   Discharge Medications:    Medication List    ASK your doctor about these medications        acetaminophen 500 MG tablet  Commonly known as:  TYLENOL  Take 500 mg by mouth every 6 (six) hours as needed. for pain relief     ergocalciferol 50000 UNITS capsule  Commonly known as:  VITAMIN D2  Take 1 capsule (50,000 Units total) by mouth once a week.     folic acid 1 MG tablet  Commonly known as:  FOLVITE  Take 1 tablet (1 mg total) by mouth daily.     ibuprofen 600 MG tablet  Commonly known as:  ADVIL,MOTRIN  Take 1 tablet (600 mg total) by mouth every 6 (six) hours as needed.     loratadine 10 MG tablet  Commonly known as:  CLARITIN  Take 10 mg by mouth daily.     multivitamin with minerals Tabs tablet  Take 1 tablet by mouth daily.     oxyCODONE 5 MG immediate release tablet  Commonly known as:  Oxy IR/ROXICODONE  Take 1 tablet (5 mg total) by mouth every 4 (four) hours as needed for severe pain.         Consults:  None  Significant Diagnostic Studies:  Dg Shoulder Left  12/24/2014  CLINICAL DATA:  Chronic left shoulder pain for 3 months. Initial encounter. EXAM: LEFT SHOULDER - 2+ VIEW COMPARISON:  None. FINDINGS: There is minimal cortical irregularity along the medial aspect of the left humeral head, with linear subcortical lucency. This raises suspicion for avascular necrosis and mild cortical collapse of the left humeral head. The left humeral head remains seated at the glenoid fossa. The acromioclavicular joint is unremarkable in appearance. No significant soft tissue abnormalities are seen. The visualized portions of the left lung are clear. IMPRESSION: Minimal  cortical irregularity along the medial aspect of the left humeral head, with linear subcortical lucency. This raises suspicion for avascular necrosis and mild cortical collapse of the left humeral head. Electronically Signed   By: Garald Balding M.D.   On: 12/24/2014 19:24     [facility] Course: Ms. Raeli Wiens, a 24 year old was admitted to the day infusion center for extended observation. Reviewed labs, consistent with baseliine.  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 She uses Oxycodone 5 mg immediate release at home periodically for severe pain.  She was started on IV fluids for cellular rehydration. Patient was also given Toradol 30 mg IV times 1 for inflammation. Patient received a total of 3 mg of dilaudid via IV push. Patient experienced nausea following IV pain medication, she received Phenergan 6.25 mg IV times 1, resolved. Pain intensity decreased from 8/10 ot 4/10. Did not give Oxycodone immediate relief prior to discharge. Patient is alert, oriented, and ambulatory. Reminded to take medications consistently and hydrate. Patient is to follow up in office as previously scheduled.  Physical Exam at Discharge:    BP 100/62 mmHg  Pulse 76  Temp(Src) 99.1 F (37.3 C)  Resp 20  Ht 5\' 3"  (1.6 m)  Wt 120 lb (54.432 kg)  BMI 21.26 kg/m2  SpO2 100%  LMP 12/30/2014  General Appearance:    Alert, cooperative,  no distress, appears stated age  Head:    Normocephalic, without obvious abnormality, atraumatic  Eyes:    PERRL, conjunctiva/corneas clear, EOM's intact, fundi    benign, both eyes  Ears:    Normal TM's and external ear canals, both ears  Nose:   Nares normal, septum midline, mucosa normal, no drainage    or sinus tenderness  Throat:   Lips, mucosa, and tongue normal; teeth and gums normal  Neck:   Supple, symmetrical, trachea midline, no adenopathy;    thyroid:  no enlargement/tenderness/nodules; no carotid   bruit or JVD  Back:     Symmetric, no curvature, ROM normal, no  CVA tenderness  Lungs:     Clear to auscultation bilaterally, respirations unlabored  Chest Wall:    No tenderness or deformity   Heart:    Regular rate and rhythm, S1 and S2 normal, no murmur, rub   or gallop  Abdomen:     Soft, non-tender, bowel sounds active all four quadrants,    no masses, no organomegaly  Extremities:   Extremities normal, atraumatic, no cyanosis or edema  Pulses:   2+ and symmetric all extremities  Skin:   Skin color, texture, turgor normal, no rashes or lesions  Lymph nodes:   Cervical, supraclavicular, and axillary nodes normal  Neurologic:   CNII-XII intact, normal strength, sensation and reflexes    throughout   Disposition at Discharge: 01-Home or Self Care  Discharge Orders:   Condition at Discharge:   Stable  Time spent on Discharge:  15 minutes  Signed: Gyasi Hazzard M 01/02/2015, 4:45 PM

## 2015-01-02 NOTE — H&P (Signed)
[facility] History and Physical   Date: 01/02/2015  Sabrina Short name: Sabrina Short Medical record number: [id number] Date of birth: 12/12/1990 Age: 24 y.o. Gender: female PCP: No PCP Per Sabrina Short  Attending physician: Tresa Garter, MD  Chief Complaint: Left upper extremity pain  History of Present Illness: Sabrina Short, Sabrina Short with a history of sickle cell anemia, HbSC presents with left shoulder and left arm pain consistent with previous sickle cell crisis. She reports that pain started 4 days ago. She attributes pain crisis to weather changes. Sabrina Short also has a follow up with orthopedic surgeon related to left shoulder avascular necrosis. Current pain intensity is 8/10 described as constant and throbbing. She reports that she last had Oxycodone IR 5 mg around 8 am with minimal relief. She reports that she is taking all other medications consistently. Sabrina Short denies headache, fatigue, nausea, vomiting, diarrhea, or constipation.   Meds: Prescriptions prior to admission  Medication Sig Dispense Refill Last Dose  . acetaminophen (TYLENOL) 500 MG tablet Take 500 mg by mouth every 6 (six) hours as needed. for pain relief   Taking  . ergocalciferol (VITAMIN D2) 50000 UNITS capsule Take 1 capsule (50,000 Units total) by mouth once a week. 4 capsule 2   . folic acid (FOLVITE) 1 MG tablet Take 1 tablet (1 mg total) by mouth daily. 30 tablet 11   . ibuprofen (ADVIL,MOTRIN) 600 MG tablet Take 1 tablet (600 mg total) by mouth every 6 (six) hours as needed. 30 tablet 1   . loratadine (CLARITIN) 10 MG tablet Take 10 mg by mouth daily.   Taking  . Multiple Vitamin (MULITIVITAMIN WITH MINERALS) TABS Take 1 tablet by mouth daily.   Taking  . oxyCODONE (OXY IR/ROXICODONE) 5 MG immediate release tablet Take 1 tablet (5 mg total) by mouth every 4 (four) hours as needed for severe pain. 30 tablet 0     Allergies: Morphine and related Past Medical History  Diagnosis Date  .  Sickle cell disease ([place])   . 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 Acute chest syndrome due to sickle-cell disease ([place])   . Blood transfusion    Past Surgical History  Procedure Laterality Date  . Wisdom teeth removal    . Cholecystectomy     Family History  Problem Relation Age of Onset  . Hypertension Mother   . Stroke Maternal Grandmother   . Diabetes Maternal Grandmother   . Cancer Paternal Grandmother     breast cancer   Social History   Social History  . Marital Status: Single    Spouse Name: N/A  . Number of Children: N/A  . Years of Education: N/A   Occupational History  . Not on file.   Social History Main Topics  . Smoking status: Former Research scientist (life sciences)  . Smokeless tobacco: Never Used  . Alcohol Use: Yes     Comment: social  . Drug Use: No     Comment: occ  . Sexual Activity: Not on file   Other Topics Concern  . Not on file   Social History Narrative    Review of Systems: Constitutional: negative for fatigue and fevers Eyes: negative Ears, nose, mouth, throat, and face: negative Respiratory: negative for cough, dyspnea on exertion and pneumonia Cardiovascular: negative for chest pain, dyspnea, fatigue, orthopnea, syncope and tachypnea Gastrointestinal: negative Genitourinary:negative Integument/breast: negative Hematologic/lymphatic: negative Musculoskeletal:positive for myalgias and Left arm and shoulder pain Neurological: negative Behavioral/Psych: negative  Endocrine: negative Allergic/Immunologic: negative  Physical Exam: Blood pressure 100/62, pulse 76, temperature 99.1 F (37.3 C), resp. rate 20, height 5\' 3"  (1.6 m), weight 120 lb (54.432 kg), last menstrual period 12/30/2014, SpO2 100 %. BP 100/62 mmHg  Pulse 76  Temp(Src) 99.1 F (37.3 C)  Resp 20  Ht 5\' 3"  (1.6 m)  Wt 120 lb (54.432 kg)  BMI 21.26 kg/m2  SpO2 100%  LMP 12/30/2014  General Appearance:    Alert, cooperative, mild distress, appears stated age  Head:     Normocephalic, without obvious abnormality, atraumatic  Eyes:    PERRL, conjunctiva/corneas clear, EOM's intact, fundi    benign, both eyes  Ears:    Normal TM's and external ear canals, both ears  Nose:   Nares normal, septum midline, mucosa normal, no drainage    or sinus tenderness  Throat:   Lips, mucosa, and tongue normal; teeth and gums normal  Neck:   Supple, symmetrical, trachea midline, no adenopathy;    thyroid:  no enlargement/tenderness/nodules; no carotid   bruit or JVD  Back:     Symmetric, no curvature, ROM normal, no CVA tenderness  Lungs:     Clear to auscultation bilaterally, respirations unlabored  Chest Wall:    No tenderness or deformity   Heart:    Regular rate and rhythm, S1 and S2 normal, no murmur, rub   or gallop  Abdomen:     Soft, non-tender, bowel sounds active all four quadrants,    no masses, no organomegaly  Extremities:   Left upper extremity weakness with decreased range of motion, strength 4+, atraumatic, no cyanosis or edema  Pulses:   2+ and symmetric all extremities  Skin:   Skin color, texture, turgor normal, no rashes or lesions  Lymph nodes:   Cervical, supraclavicular, and axillary nodes normal  Neurologic:   CNII-XII intact, normal strength, sensation and reflexes    throughout    Lab results: No results found for this or any previous visit (from the past 24 hour(s)).  Imaging results:  No results found.   Assessment & Plan:  Sabrina Short will be admitted to the day infusion center for extended observation  Start IV D5.45 for cellular rehydration at 125/hr  Start Toradol 30 mg IV every 6 hours for inflammation.  Will start with IV dilaudid per push due to opiate intolerance   Sabrina Short will be re-evaluated for pain intensity in the context of function and relationship to baseline as care progresses.  If no significant pain relief, will transfer Sabrina Short to inpatient services for a higher level of care.   Will check CMP, reticulocytes,   LDH and CBC w/differential  Leonel Mccollum M 01/02/2015, 12:13 PM

## 2015-01-02 NOTE — Telephone Encounter (Signed)
Pt called requesting to come [facility]. Pt states pain is 8/10. Pain in her left arm and shoulder pain. Took last pain med at 8am, Oxycodone 5mg . Will check with provider and call her back. Pt voiced understanding.

## 2015-01-04 ENCOUNTER — Telehealth: Payer: Self-pay | Admitting: Family Medicine

## 2015-01-04 DIAGNOSIS — D571 Sickle-cell disease without crisis: Secondary | ICD-10-CM

## 2015-01-05 NOTE — Telephone Encounter (Signed)
Refill request for Oxycodone 5mg . LOV 12/20/2014. Please advise. Thanks!

## 2015-01-09 MED ORDER — OXYCODONE HCL 5 MG PO TABS
5.0000 mg | ORAL_TABLET | ORAL | Status: DC | PRN
Start: 2015-01-09 — End: 2015-01-25

## 2015-01-09 NOTE — Telephone Encounter (Signed)
Reviewed [place] Substance Reporting system prior to reorder. No inconsistencies noted.   Meds ordered this encounter  Medications  . oxyCODONE (OXY IR/ROXICODONE) 5 MG immediate release tablet    Sig: Take 1 tablet (5 mg total) by mouth every 4 (four) hours as needed for severe pain.    Dispense:  30 tablet    Refill:  0    Order Specific Question:  Supervising Provider    Answer:  Tresa Garter [[id number]]    Dorena Dew, FNP

## 2015-01-20 ENCOUNTER — Ambulatory Visit (INDEPENDENT_AMBULATORY_CARE_PROVIDER_SITE_OTHER): Payer: Managed Care, Other (non HMO) | Admitting: Family Medicine

## 2015-01-20 ENCOUNTER — Encounter: Payer: Self-pay | Admitting: Family Medicine

## 2015-01-20 VITALS — BP 118/85 | HR 99 | Temp 97.8°F | Resp 16 | Ht 63.0 in | Wt 119.0 lb

## 2015-01-20 DIAGNOSIS — D571 Sickle-cell disease without crisis: Secondary | ICD-10-CM

## 2015-01-20 DIAGNOSIS — F411 Generalized anxiety disorder: Secondary | ICD-10-CM

## 2015-01-20 DIAGNOSIS — R05 Cough: Secondary | ICD-10-CM

## 2015-01-20 DIAGNOSIS — M25512 Pain in left shoulder: Secondary | ICD-10-CM

## 2015-01-20 DIAGNOSIS — R059 Cough, unspecified: Secondary | ICD-10-CM

## 2015-01-20 LAB — POCT URINALYSIS DIP (DEVICE)
Bilirubin Urine: NEGATIVE
Glucose, UA: NEGATIVE mg/dL
Hgb urine dipstick: NEGATIVE
Ketones, ur: NEGATIVE mg/dL
Leukocytes, UA: NEGATIVE
Nitrite: NEGATIVE
PH: 6 (ref 5.0–8.0)
PROTEIN: NEGATIVE mg/dL
Specific Gravity, Urine: 1.02 (ref 1.005–1.030)
UROBILINOGEN UA: 0.2 mg/dL (ref 0.0–1.0)

## 2015-01-20 MED ORDER — BUSPIRONE HCL 5 MG PO TABS: 5.0000 mg | ORAL_TABLET | Freq: Two times a day (BID) | ORAL | Status: DC

## 2015-01-20 MED ORDER — BENZONATATE 100 MG PO CAPS: 100.0000 mg | ORAL_CAPSULE | Freq: Two times a day (BID) | ORAL | Status: DC | PRN

## 2015-01-20 NOTE — Progress Notes (Signed)
Subjective:    Patient ID: Sabrina Short, female    DOB: 1990-06-20, 24 y.o.   MRN: [id number]  HPI Sabrina Short, a student with a history of sickle cell anemia, HbSC. She is currently complaining of left shoulder pain. She states that pain intensity is currently 3-4/10 described as intermittent and aching. She states that pain is increased by activity. 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  She states that she has been taking Ibuprofen as prescribed for increased pain to left shoulder. She is currently followed by Dr. French Ana at [facility]. She is currently not a candidate for surgery and defers intraarticular injections at this point. She states that she has taken Ibuprofen this am with moderate relief. She can only take Oxycodone at night if needed.   Patient complains of increased anxiety over the past several weeks. She states that she has had anxiety and panic attacks in the past. She has been followed by [facility] counseling services over the past month.  She reports that she has excessive worrying, decreased concentration, and restlessness.She denies current suicidal and homicidal ideation.    Past Medical History  Diagnosis Date  . Sickle cell disease ([place])   . 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 Acute chest syndrome due to sickle-cell disease ([place])   . Blood transfusion   . avascular necrosis of left shoulder (HCC)    There is no immunization history on file for this patient.  Allergies  Allergen Reactions  . Morphine And Related    Social History   Social History  . Marital Status: Single    Spouse Name: N/A  . Number of Children: N/A  . Years of Education: N/A   Occupational History  . Not on file.   Social History Main Topics  . Smoking status: Former Research scientist (life sciences)  . Smokeless tobacco: Never Used  . Alcohol Use: Yes     Comment: social  . Drug Use: No     Comment: occ  . Sexual Activity: Yes   Birth Control/ Protection: IUD   Other Topics Concern  . Not on file   Social History Narrative   Review of Systems  Constitutional: Negative.   HENT: Negative.   Eyes: Negative.   Respiratory: Negative.   Cardiovascular: Negative.   Gastrointestinal: Negative.   Endocrine: Negative for polydipsia, polyphagia and polyuria.  Genitourinary: Negative.   Musculoskeletal: Positive for myalgias (left shoulder pain).  Skin: Negative.   Allergic/Immunologic: Negative for environmental allergies and food allergies.  Neurological: Negative.  Negative for dizziness and headaches.  Hematological: Negative.   Psychiatric/Behavioral: Negative.        Objective:   Physical Exam  Constitutional: She is oriented to person, place, and time. She appears well-developed and well-nourished.  HENT:  Head: Normocephalic and atraumatic.  Right Ear: External ear normal.  Left Ear: External ear normal.  Mouth/Throat: Oropharynx is clear and moist.  Eyes: Conjunctivae and EOM are normal. Pupils are equal, round, and reactive to light.  Cardiovascular: Normal rate, regular rhythm, normal heart sounds and intact distal pulses.   Pulmonary/Chest: Effort normal and breath sounds normal.  Abdominal: Soft. Bowel sounds are normal.  Musculoskeletal: Normal range of motion.  Neurological: She is alert and oriented to person, place, and time. She has normal reflexes.  Skin: Skin is warm and dry.  Psychiatric: She has a normal mood and affect. Her behavior is normal. Judgment and thought  content normal.      BP 118/85 mmHg  Pulse 99  Temp(Src) 97.8 F (36.6 C) (Oral)  Resp 16  Ht 5\' 3"  (1.6 m)  Wt 119 lb (53.978 kg)  BMI 21.09 kg/m2  LMP 12/30/2014 Assessment & Plan:  1. Hb-SS disease without crisis ([place]) Sickle cell disease - The patient was reminded of the need to seek medical attention of any symptoms of bleeding, anemia, or infection. Continue folic acid 1 mg daily to prevent aplastic bone marrow  crises.   Reviewed previous labs with patient.    Pulmonary evaluation - Patient denies severe recurrent wheezes, shortness of breath with exercise, or persistent cough. If these symptoms develop, pulmonary function tests with spirometry will be ordered, and if abnormal, plan on referral to Pulmonology for further evaluation.  Cardiac - Routine screening for pulmonary hypertension is not recommended.  Eye - High risk of proliferative retinopathy. Annual eye exam with retinal exam recommended to patient. Last eye examination was 1 month ago.   Acute and chronic painful episodes - We agreed on Oxycodone 5 mg every 4 hours for severe pain. She limits opiate medications to night time. She typically takes Ibuprofen during the day for increased pain intensity.  We discussed that pt is to receive her Schedule II prescriptions only from [place]. Pt is also aware that the prescription history is available to [place] online through the [facility] CSRS. Controlled substance agreement signed 12/20/2014. We reminded Sabrina Short that all patients receiving Schedule II narcotics must be seen for follow within one month of prescription being requested. We reviewed the terms of our pain agreement, including the need to keep medicines in a safe locked location away from children or pets, and the need to report excess sedation or constipation, measures to avoid constipation, and policies related to early refills and stolen prescriptions. According to the [place] [facility] program, we have reviewed details related to analgesia, adverse effects, aberrant behaviors. Reviewed [place] Substance Reporting system prior to reorder.     2. Generalized anxiety disorder Patient was assessed with (GAD 7 tool) for Generalized anxiety disorder. Patient score 15, which is indicative of severe anxiety. Recommend that patient continue counseling with [facility]'s counseling center. Will start a trial of Buspar and follow up in 1 month.  - busPIRone (BUSPAR) 5  MG tablet; Take 1 tablet (5 mg total) by mouth 2 (two) times daily.  Dispense: 60 tablet; Refill: 0  3. Left shoulder pain Sabrina Short is followed by Dr. French Ana at [facility] for avascular necrosis.  4. Cough She reports a cough over the past week described as nagging and dry. Recommend that patient increase water intake, handwashing, and vitamin C intake.  - benzonatate (TESSALON) 100 MG capsule; Take 1 capsule (100 mg total) by mouth 2 (two) times daily as needed for cough.  Dispense: 20 capsule; Refill: 0   RTC: 1 month  Hollis,Lachina M, FNP    The patient was given clear instructions to go to ER or return to medical center if symptoms do not improve, worsen or new problems develop. The patient verbalized understanding. Will notify patient with laboratory results.

## 2015-01-20 NOTE — Patient Instructions (Addendum)
Start low dose of Buspar 5 mg twice daily for increased anxiety. ont Continue Counseling sessions with [facility] mental health department Continue Ibuprofen 600 every 8 hours as needed with food Continue Oxycodone 5 mg every 6 hours as needed for moderate to severe pain.  Use heat therapy on shoulder 20 minutes 4 times per dayGeneralized Anxiety Disorder Generalized anxiety disorder (GAD) is a mental disorder. It interferes with life functions, including relationships, work, and school. GAD is different from normal anxiety, which everyone experiences at some point in their lives in response to specific life events and activities. Normal anxiety actually helps [place] prepare for and get through these life events and activities. Normal anxiety goes away after the event or activity is over.  GAD causes anxiety that is not necessarily related to specific events or activities. It also causes excess anxiety in proportion to specific events or activities. The anxiety associated with GAD is also difficult to control. GAD can vary from mild to severe. People with severe GAD can have intense waves of anxiety with physical symptoms (panic attacks).  SYMPTOMS The anxiety and worry associated with GAD are difficult to control. This anxiety and worry are related to many life events and activities and also occur more days than not for 6 months or longer. People with GAD also have three or more of the following symptoms (one or more in children):  Restlessness.   Fatigue.  Difficulty concentrating.   Irritability.  Muscle tension.  Difficulty sleeping or unsatisfying sleep. DIAGNOSIS GAD is diagnosed through an assessment by your health care provider. Your health care provider will ask you questions aboutyour mood,physical symptoms, and events in your life. Your health care provider may ask you about your medical history and use of alcohol or drugs, including prescription medicines. Your health care provider may  also do a physical exam and blood tests. Certain medical conditions and the use of certain substances can cause symptoms similar to those associated with GAD. Your health care provider may refer you to a mental health specialist for further evaluation. TREATMENT The following therapies are usually used to treat GAD:   Medication. Antidepressant medication usually is prescribed for long-term daily control. Antianxiety medicines may be added in severe cases, especially when panic attacks occur.   Talk therapy (psychotherapy). Certain types of talk therapy can be helpful in treating GAD by providing support, education, and guidance. A form of talk therapy called cognitive behavioral therapy can teach you healthy ways to think about and react to daily life events and activities.  Stress managementtechniques. These include yoga, meditation, and exercise and can be very helpful when they are practiced regularly. A mental health specialist can help determine which treatment is best for you. Some people see improvement with one therapy. However, other people require a combination of therapies.   This information is not intended to replace advice given to you by your health care provider. Make sure you discuss any questions you have with your health care provider.   Document Released: 06/29/2012 Document Revised: 03/25/2014 Document Reviewed: 06/29/2012 Elsevier Interactive Patient Education 2016 [employer]. Buspirone tablets What is this medicine? BUSPIRONE (byoo SPYE rone) is used to treat anxiety disorders. This medicine may be used for other purposes; ask your health care provider or pharmacist if you have questions. What should I tell my health care provider before I take this medicine? They need to know if you have any of these conditions: -kidney or liver disease -an unusual or allergic reaction to  buspirone, other medicines, foods, dyes, or preservatives -pregnant or trying to get  pregnant -breast-feeding How should I use this medicine? Take this medicine by mouth with a glass of water. Follow the directions on the prescription label. You may take this medicine with or without food. To ensure that this medicine always works the same way for you, you should take it either always with or always without food. Take your doses at regular intervals. Do not take your medicine more often than directed. Do not stop taking except on the advice of your doctor or health care professional. Talk to your pediatrician regarding the use of this medicine in children. Special care may be needed. Overdosage: If you think you have taken too much of this medicine contact a poison control center or emergency room at once. NOTE: This medicine is only for you. Do not share this medicine with others. What if I miss a dose? If you miss a dose, take it as soon as you can. If it is almost time for your next dose, take only that dose. Do not take double or extra doses. What may interact with this medicine? Do not take this medicine with any of the following medications: -linezolid -MAOIs like Carbex, Eldepryl, Marplan, Nardil, and Parnate -methylene blue -procarbazine This medicine may also interact with the following medications: -diazepam -digoxin -diltiazem -erythromycin -grapefruit juice -haloperidol -medicines for mental depression or mood problems -medicines for seizures like carbamazepine, phenobarbital and phenytoin -nefazodone -other medications for anxiety -rifampin -ritonavir -some antifungal medicines like itraconazole, ketoconazole, and voriconazole -verapamil -warfarin This list may not describe all possible interactions. Give your health care provider a list of all the medicines, herbs, non-prescription drugs, or dietary supplements you use. Also tell them if you smoke, drink alcohol, or use illegal drugs. Some items may interact with your medicine. What should I watch for  while using this medicine? Visit your doctor or health care professional for regular checks on your progress. It may take 1 to 2 weeks before your anxiety gets better. You may get drowsy or dizzy. Do not drive, use machinery, or do anything that needs mental alertness until you know how this drug affects you. Do not stand or sit up quickly, especially if you are an older patient. This reduces the risk of dizzy or fainting spells. Alcohol can make you more drowsy and dizzy. Avoid alcoholic drinks. What side effects may I notice from receiving this medicine? Side effects that you should report to your doctor or health care professional as soon as possible: -blurred vision or other vision changes -chest pain -confusion -difficulty breathing -feelings of hostility or anger -muscle aches and pains -numbness or tingling in hands or feet -ringing in the ears -skin rash and itching -vomiting -weakness Side effects that usually do not require medical attention (report to your doctor or health care professional if they continue or are bothersome): -disturbed dreams, nightmares -headache -nausea -restlessness or nervousness -sore throat and nasal congestion -stomach upset This list may not describe all possible side effects. Call your doctor for medical advice about side effects. You may report side effects to FDA at 1-800-FDA-1088. Where should I keep my medicine? Keep out of the reach of children. Store at room temperature below 30 degrees C (86 degrees F). Protect from light. Keep container tightly closed. Throw away any unused medicine after the expiration date. NOTE: This sheet is a summary. It may not cover all possible information. If you have questions about this medicine, talk to  your doctor, pharmacist, or health care provider.    2016, Elsevier/Gold Standard. (2009-10-12 18:06:11) Sickle Cell Anemia, Adult Sickle cell anemia is a condition in which red blood cells have an abnormal  "sickle" shape. This abnormal shape shortens the cells' life span, which results in a lower than normal concentration of red blood cells in the blood. The sickle shape also causes the cells to clump together and block free blood flow through the blood vessels. As a result, the tissues and organs of the body do not receive enough oxygen. Sickle cell anemia causes organ damage and pain and increases the risk of infection. CAUSES  Sickle cell anemia is a genetic disorder. Those who receive two copies of the gene have the condition, and those who receive one copy have the trait. RISK FACTORS The sickle cell gene is most common in people whose families originated in [place]. Other areas of the globe where sickle cell trait occurs include the [place], [place] and [place], and the [place].  SIGNS AND SYMPTOMS  Pain, especially in the extremities, back, chest, or abdomen (common). The pain may start suddenly or may develop following an illness, especially if there is dehydration. Pain can also occur due to overexertion or exposure to extreme temperature changes.  Frequent severe bacterial infections, especially certain types of pneumonia and meningitis.  Pain and swelling in the hands and feet.  Decreased activity.   Loss of appetite.   Change in behavior.  Headaches.  Seizures.  Shortness of breath or difficulty breathing.  Vision changes.  Skin ulcers. Those with the trait may not have symptoms or they may have mild symptoms.  DIAGNOSIS  Sickle cell anemia is diagnosed with blood tests that demonstrate the genetic trait. It is often diagnosed during the newborn period, due to mandatory testing nationwide. A variety of blood tests, X-rays, CT scans, MRI scans, ultrasounds, and lung function tests may also be done to monitor the condition. TREATMENT  Sickle cell anemia may be treated with:  Medicines. You may be given pain medicines, antibiotic medicines (to  treat and prevent infections) or medicines to increase the production of certain types of hemoglobin.  Fluids.  Oxygen.  Blood transfusions. HOME CARE INSTRUCTIONS   Drink enough fluid to keep your urine clear or pale yellow. Increase your fluid intake in hot weather and during exercise.  Do not smoke. Smoking lowers oxygen levels in the blood.   Only take over-the-counter or prescription medicines for pain, fever, or discomfort as directed by your health care provider.  Take antibiotics as directed by your health care provider. Make sure you finish them it even if you start to feel better.   Take supplements as directed by your health care provider.   Consider wearing a medical alert bracelet. This tells anyone caring for you in an emergency of your condition.   When traveling, keep your medical information, health care provider's names, and the medicines you take with you at all times.   If you develop a fever, do not take medicines to reduce the fever right away. This could cover up a problem that is developing. Notify your health care provider.  Keep all follow-up appointments with your health care provider. Sickle cell anemia requires regular medical care. SEEK MEDICAL CARE IF: You have a fever. SEEK IMMEDIATE MEDICAL CARE IF:   You feel dizzy or faint.   You have new abdominal pain, especially on the left side near the stomach area.   You develop  a persistent, often uncomfortable and painful penile erection (priapism). If this is not treated immediately it will lead to impotence.   You have numbness your arms or legs or you have a hard time moving them.   You have a hard time with speech.   You have a fever or persistent symptoms for more than 2-3 days.   You have a fever and your symptoms suddenly get worse.   You have signs or symptoms of infection. These include:   Chills.   Abnormal tiredness (lethargy).   Irritability.   Poor eating.    Vomiting.   You develop pain that is not helped with medicine.   You develop shortness of breath.  You have pain in your chest.   You are coughing up pus-like or bloody sputum.   You develop a stiff neck.  Your feet or hands swell or have pain.  Your abdomen appears bloated.  You develop joint pain. MAKE SURE YOU:  Understand these instructions.   This information is not intended to replace advice given to you by your health care provider. Make sure you discuss any questions you have with your health care provider.   Document Released: 06/12/2005 Document Revised: 03/25/2014 Document Reviewed: 10/14/2012 Elsevier Interactive Patient Education [employer].

## 2015-01-25 ENCOUNTER — Telehealth: Payer: Self-pay | Admitting: General Practice

## 2015-01-25 DIAGNOSIS — D571 Sickle-cell disease without crisis: Secondary | ICD-10-CM

## 2015-01-25 MED ORDER — OXYCODONE HCL 5 MG PO TABS: 5.0000 mg | ORAL_TABLET | ORAL | Status: DC | PRN

## 2015-01-25 NOTE — Telephone Encounter (Signed)
Refill request for oxycodone 5mg . LOV 01/20/2015. Please advise. Thanks!

## 2015-01-25 NOTE — Telephone Encounter (Signed)
Reviewed [place] Substance Reporting system prior to reorder No inconsistencies noted.   Meds ordered this encounter  Medications  . oxyCODONE (OXY IR/ROXICODONE) 5 MG immediate release tablet    Sig: Take 1 tablet (5 mg total) by mouth every 4 (four) hours as needed for severe pain.    Dispense:  60 tablet    Refill:  0    Order Specific Question:  Supervising Provider    Answer:  Tresa Garter [[id number]]    Dorena Dew, FNP

## 2015-02-21 ENCOUNTER — Encounter: Payer: Self-pay | Admitting: Internal Medicine

## 2015-02-21 ENCOUNTER — Ambulatory Visit (INDEPENDENT_AMBULATORY_CARE_PROVIDER_SITE_OTHER): Payer: Managed Care, Other (non HMO) | Admitting: Internal Medicine

## 2015-02-21 VITALS — BP 122/80 | HR 93 | Temp 98.6°F | Resp 20 | Ht 62.0 in | Wt 120.0 lb

## 2015-02-21 DIAGNOSIS — D571 Sickle-cell disease without crisis: Secondary | ICD-10-CM | POA: Diagnosis not present

## 2015-02-21 DIAGNOSIS — M25512 Pain in left shoulder: Secondary | ICD-10-CM

## 2015-02-21 MED ORDER — OXYCODONE HCL 5 MG PO TABS
5.0000 mg | ORAL_TABLET | ORAL | Status: DC | PRN
Start: 2015-02-21 — End: 2015-03-16

## 2015-02-21 NOTE — Progress Notes (Signed)
Patient ID: Sabrina Short, female   DOB: May 23, 1990, 24 y.o.   MRN: [id number]   Sabrina Short, is a 24 y.o. female  [id number]  [id number]  DOB - 02/29/1991  No chief complaint on file.       Subjective:   Sabrina Short is a 24 y.o. 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 Her major complaint today is ongoing left shoulder pain now is 7 out of 10. She is out of her pain medication and she'll request a refill today. She does not take the oxycodone except only at night because she has to stay awake for her course work at [employer], she graduates next week. She follows up with orthopedic surgeon for the avascular necrosis of her left shoulder but currently not a candidate for shoulder replacement. She had been in much pain for the past 2 weeks and had missed classes, she requested to have a letter of support to her school. She has no fever, she is compliant with her medications, she stay hydrated. Patient has No headache, No chest pain, No abdominal pain - No Nausea, No new weakness tingling or numbness, No Cough - SOB.  No problems updated.  ALLERGIES: Allergies  Allergen Reactions  . Morphine And Related     PAST MEDICAL HISTORY: Past Medical History  Diagnosis Date  . Sickle cell disease ([place])   . 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 Acute chest syndrome due to sickle-cell disease ([place])   . Blood transfusion   . avascular necrosis of left shoulder ([place])     MEDICATIONS AT HOME: Prior to Admission medications   Medication Sig Start Date End Date Taking? Authorizing Provider  acetaminophen (TYLENOL) 500 MG tablet Take 500 mg by mouth every 6 (six) hours as needed. for pain relief   Yes Historical Provider, MD  busPIRone (BUSPAR) 5 MG tablet Take 1 tablet (5 mg total) by mouth 2 (two) times daily. 01/20/15  Yes Dorena Dew, FNP   ergocalciferol (VITAMIN D2) 50000 UNITS capsule Take 1 capsule (50,000 Units total) by mouth once a week. 12/21/14  Yes Dorena Dew, FNP  folic acid (FOLVITE) 1 MG tablet Take 1 tablet (1 mg total) by mouth daily. 12/20/14  Yes Dorena Dew, FNP  ibuprofen (ADVIL,MOTRIN) 600 MG tablet Take 1 tablet (600 mg total) by mouth every 6 (six) hours as needed. 12/20/14  Yes Dorena Dew, FNP  Multiple Vitamin (MULITIVITAMIN WITH MINERALS) TABS Take 1 tablet by mouth daily.   Yes Historical Provider, MD  ondansetron (ZOFRAN) 4 MG tablet Take 1 tablet (4 mg total) by mouth every 8 (eight) hours as needed for nausea or vomiting. 01/02/15  Yes Dorena Dew, FNP  oxyCODONE (OXY IR/ROXICODONE) 5 MG immediate release tablet Take 1 tablet (5 mg total) by mouth every 4 (four) hours as needed for severe pain. 02/21/15  Yes Tresa Garter, MD  benzonatate (TESSALON) 100 MG capsule Take 1 capsule (100 mg total) by mouth 2 (two) times daily as needed for cough. Patient not taking: Reported on 02/21/2015 01/20/15   Dorena Dew, FNP  loratadine (CLARITIN) 10 MG tablet Take 10 mg by mouth daily.    Historical Provider, MD     Objective:   Filed Vitals:   02/21/15 1138  BP: 122/80  Pulse: 93  Temp: 98.6 F (37  C)  TempSrc: Oral  Resp: 20  Height: 5\' 2"  (1.575 m)  Weight: 120 lb (54.432 kg)  SpO2: 98%    Exam General appearance : Awake, alert, not in any distress. Speech Clear. Not toxic looking HEENT: Atraumatic and Normocephalic, pupils equally reactive to light and accomodation Neck: supple, no JVD. No cervical lymphadenopathy.  Chest:Good air entry bilaterally, no added sounds  CVS: S1 S2 regular, no murmurs.  Abdomen: Bowel sounds present, Non tender and not distended with no gaurding, rigidity or rebound. Extremities: Restricted range of motion on left shoulder due to pain. B/L Lower Ext shows no edema, both legs are warm to touch Neurology: Awake alert, and oriented X 3, CN  II-XII intact, Non focal Skin:No Rash  Data Review No results found for: HGBA1C   Assessment & Plan   1. Hb-SS disease without crisis ([place])  - oxyCODONE (OXY IR/ROXICODONE) 5 MG immediate release tablet; Take 1 tablet (5 mg total) by mouth every 4 (four) hours as needed for severe pain.  Dispense: 60 tablet; Refill: 0  - We agreed on Opiate dose and amount of pills  per month. We discussed that pt is to receive Schedule II prescriptions only from our clinic. Pt is also aware that the prescription history is available to [place] online through the [facility] CSRS. Controlled substance agreement reviewed and signed. We reminded [place] that all patients receiving Schedule II narcotics must be seen for follow within one month of prescription being requested. We reviewed the terms of our pain agreement, including the need to keep medicines in a safe locked location away from children or pets, and the need to report excess sedation or constipation, measures to avoid constipation, and policies related to early refills and stolen prescriptions. According to the [place] Chronic Pain Initiative program, we have reviewed details related to analgesia, adverse effects and aberrant behaviors.   We discussed the need for good hydration, monitoring of hydration status, avoidance of heat, cold, stress, and infection triggers.   Immunization status: Yearly influenza vaccination is recommended, as well as being up to date with Meningococcal and Pneumococcal vaccines.  Patient declined to have influenza vaccine today, she also prefers to have them come fasting and later date.   2. Left shoulder pain  Continue ibuprofen and oxycodone when necessary Physical exercise Follow-up with orthopedic surgery  Patient have been counseled extensively about nutrition and exercise  Return in about 6 weeks (around 04/04/2015) for Sickle Cell Disease/Pain.  The patient was given clear instructions to go to ER or return to medical center  if symptoms don't improve, worsen or new problems develop. The patient verbalized understanding. The patient was told to call to get lab results if they haven't heard anything in the next week.   This note has been created with Surveyor, quantity. Any transcriptional errors are unintentional.    Angelica Chessman, MD, [place], [place], [place], [place] and [place], [place]   02/21/2015, 12:15 PM

## 2015-02-21 NOTE — Patient Instructions (Signed)
Sickle Cell Anemia, Adult Sickle cell anemia is a condition in which red blood cells have an abnormal "sickle" shape. This abnormal shape shortens the cells' life span, which results in a lower than normal concentration of red blood cells in the blood. The sickle shape also causes the cells to clump together and block free blood flow through the blood vessels. As a result, the tissues and organs of the body do not receive enough oxygen. Sickle cell anemia causes organ damage and pain and increases the risk of infection. CAUSES  Sickle cell anemia is a genetic disorder. Those who receive two copies of the gene have the condition, and those who receive one copy have the trait. RISK FACTORS The sickle cell gene is most common in people whose families originated in [place]. Other areas of the globe where sickle cell trait occurs include the Mediterranean, South and [place], the [place], and the Middle East.  SIGNS AND SYMPTOMS  Pain, especially in the extremities, back, chest, or abdomen (common). The pain may start suddenly or may develop following an illness, especially if there is dehydration. Pain can also occur due to overexertion or exposure to extreme temperature changes.  Frequent severe bacterial infections, especially certain types of pneumonia and meningitis.  Pain and swelling in the hands and feet.  Decreased activity.   Loss of appetite.   Change in behavior.  Headaches.  Seizures.  Shortness of breath or difficulty breathing.  Vision changes.  Skin ulcers. Those with the trait may not have symptoms or they may have mild symptoms.  DIAGNOSIS  Sickle cell anemia is diagnosed with blood tests that demonstrate the genetic trait. It is often diagnosed during the newborn period, due to mandatory testing nationwide. A variety of blood tests, X-rays, CT scans, MRI scans, ultrasounds, and lung function tests may also be done to monitor the condition. TREATMENT  Sickle  cell anemia may be treated with:  Medicines. You may be given pain medicines, antibiotic medicines (to treat and prevent infections) or medicines to increase the production of certain types of hemoglobin.  Fluids.  Oxygen.  Blood transfusions. HOME CARE INSTRUCTIONS   Drink enough fluid to keep your urine clear or pale yellow. Increase your fluid intake in hot weather and during exercise.  Do not smoke. Smoking lowers oxygen levels in the blood.   Only take over-the-counter or prescription medicines for pain, fever, or discomfort as directed by your health care provider.  Take antibiotics as directed by your health care provider. Make sure you finish them it even if you start to feel better.   Take supplements as directed by your health care provider.   Consider wearing a medical alert bracelet. This tells anyone caring for you in an emergency of your condition.   When traveling, keep your medical information, health care provider's names, and the medicines you take with you at all times.   If you develop a fever, do not take medicines to reduce the fever right away. This could cover up a problem that is developing. Notify your health care provider.  Keep all follow-up appointments with your health care provider. Sickle cell anemia requires regular medical care. SEEK MEDICAL CARE IF: You have a fever. SEEK IMMEDIATE MEDICAL CARE IF:   You feel dizzy or faint.   You have new abdominal pain, especially on the left side near the stomach area.   You develop a persistent, often uncomfortable and painful penile erection (priapism). If this is not treated immediately it   will lead to impotence.   You have numbness your arms or legs or you have a hard time moving them.   You have a hard time with speech.   You have a fever or persistent symptoms for more than 2-3 days.   You have a fever and your symptoms suddenly get worse.   You have signs or symptoms of infection.  These include:   Chills.   Abnormal tiredness (lethargy).   Irritability.   Poor eating.   Vomiting.   You develop pain that is not helped with medicine.   You develop shortness of breath.  You have pain in your chest.   You are coughing up pus-like or bloody sputum.   You develop a stiff neck.  Your feet or hands swell or have pain.  Your abdomen appears bloated.  You develop joint pain. MAKE SURE YOU:  Understand these instructions.   This information is not intended to replace advice given to you by your health care provider. Make sure you discuss any questions you have with your health care provider.   Document Released: 06/12/2005 Document Revised: 03/25/2014 Document Reviewed: 10/14/2012 Elsevier Interactive Patient Education 2016 Elsevier Inc.  

## 2015-02-21 NOTE — Progress Notes (Signed)
Patient here for 1 mth F/U  Patient complains of pain in left shoulder. Exertion increases pain. Pain is scaled at a 7.

## 2015-03-16 ENCOUNTER — Telehealth: Payer: Self-pay | Admitting: Family Medicine

## 2015-03-16 DIAGNOSIS — D571 Sickle-cell disease without crisis: Secondary | ICD-10-CM

## 2015-03-16 DIAGNOSIS — F411 Generalized anxiety disorder: Secondary | ICD-10-CM

## 2015-03-16 MED ORDER — OXYCODONE HCL 5 MG PO TABS
5.0000 mg | ORAL_TABLET | ORAL | Status: DC | PRN
Start: 2015-03-16 — End: 2015-04-14

## 2015-03-16 MED ORDER — BUSPIRONE HCL 5 MG PO TABS: 5.0000 mg | ORAL_TABLET | Freq: Two times a day (BID) | ORAL | Status: DC

## 2015-03-16 NOTE — Telephone Encounter (Signed)
Reviewed [place] Substance Reporting system prior to prescribing opiate medications, no inconsistencies noted.   Meds ordered this encounter  Medications  . oxyCODONE (OXY IR/ROXICODONE) 5 MG immediate release tablet    Sig: Take 1 tablet (5 mg total) by mouth every 4 (four) hours as needed for severe pain.    Dispense:  60 tablet    Refill:  0    Order Specific Question:  Supervising Provider    Answer:  Tresa Garter [id number]  . busPIRone (BUSPAR) 5 MG tablet    Sig: Take 1 tablet (5 mg total) by mouth 2 (two) times daily.    Dispense:  60 tablet    Refill:  0    Hollis,Lachina M, FNP

## 2015-03-16 NOTE — Telephone Encounter (Signed)
Refill request for Buspirone 5mg  and Oxycodone 5mg  LOV 02/21/2015 with [place]. Please advise. Thanks!

## 2015-04-05 ENCOUNTER — Ambulatory Visit: Payer: Managed Care, Other (non HMO) | Admitting: Family Medicine

## 2015-04-07 ENCOUNTER — Telehealth (HOSPITAL_COMMUNITY): Payer: Self-pay | Admitting: Hematology

## 2015-04-07 NOTE — Telephone Encounter (Signed)
Patient is calling to advise [place] that she is being discharged from [facility] and will be traveling to [place] with her parents.  The physician from [place] has written her a prescription for Oxycodone 5mg . I advised I would notate her chart.  Patient verbalizes understanding.

## 2015-04-12 ENCOUNTER — Telehealth: Payer: Self-pay

## 2015-04-12 DIAGNOSIS — D571 Sickle-cell disease without crisis: Secondary | ICD-10-CM

## 2015-04-12 MED ORDER — IBUPROFEN 600 MG PO TABS: 600.0000 mg | ORAL_TABLET | Freq: Four times a day (QID) | ORAL | Status: AC | PRN

## 2015-04-12 NOTE — Telephone Encounter (Signed)
Refill request for oxycodone 5mg  and Ibuprofen 600mg . LOV 02/21/2015. Please advise. I have already sent in ibuprofen. Thanks!

## 2015-04-13 ENCOUNTER — Other Ambulatory Visit: Payer: Self-pay | Admitting: General Practice

## 2015-04-13 DIAGNOSIS — F411 Generalized anxiety disorder: Secondary | ICD-10-CM

## 2015-04-13 MED ORDER — BUSPIRONE HCL 5 MG PO TABS: 5.0000 mg | ORAL_TABLET | Freq: Two times a day (BID) | ORAL | Status: DC

## 2015-04-13 NOTE — Telephone Encounter (Signed)
Refill sent to pharmacy. Thanks.

## 2015-04-14 ENCOUNTER — Other Ambulatory Visit: Payer: Self-pay | Admitting: Internal Medicine

## 2015-04-14 DIAGNOSIS — D571 Sickle-cell disease without crisis: Secondary | ICD-10-CM

## 2015-04-14 MED ORDER — OXYCODONE HCL 5 MG PO TABS: 5.0000 mg | ORAL_TABLET | ORAL | Status: DC | PRN

## 2015-04-27 ENCOUNTER — Ambulatory Visit: Payer: Managed Care, Other (non HMO) | Admitting: Family Medicine

## 2015-05-01 ENCOUNTER — Telehealth: Payer: Self-pay | Admitting: Internal Medicine

## 2015-05-01 NOTE — Telephone Encounter (Signed)
Refill request for oxycodone 5mg . LOV 02/21/2015. Please advise. Thanks!

## 2015-05-02 ENCOUNTER — Ambulatory Visit (INDEPENDENT_AMBULATORY_CARE_PROVIDER_SITE_OTHER): Payer: Managed Care, Other (non HMO) | Admitting: Family Medicine

## 2015-05-02 ENCOUNTER — Encounter: Payer: Self-pay | Admitting: Family Medicine

## 2015-05-02 VITALS — BP 113/90 | HR 105 | Temp 98.3°F | Resp 16 | Ht 62.0 in | Wt 118.0 lb

## 2015-05-02 DIAGNOSIS — D571 Sickle-cell disease without crisis: Secondary | ICD-10-CM

## 2015-05-02 DIAGNOSIS — R5383 Other fatigue: Secondary | ICD-10-CM

## 2015-05-02 DIAGNOSIS — N6310 Unspecified lump in the right breast, unspecified quadrant: Secondary | ICD-10-CM

## 2015-05-02 DIAGNOSIS — N63 Unspecified lump in breast: Secondary | ICD-10-CM | POA: Diagnosis not present

## 2015-05-02 DIAGNOSIS — N6320 Unspecified lump in the left breast, unspecified quadrant: Principal | ICD-10-CM

## 2015-05-02 LAB — COMPLETE METABOLIC PANEL WITH GFR
ALBUMIN: 4.6 g/dL (ref 3.6–5.1)
ALK PHOS: 54 U/L (ref 33–115)
ALT: 6 U/L (ref 6–29)
AST: 13 U/L (ref 10–30)
BILIRUBIN TOTAL: 1.1 mg/dL (ref 0.2–1.2)
BUN: 7 mg/dL (ref 7–25)
CALCIUM: 9.9 mg/dL (ref 8.6–10.2)
CO2: 24 mmol/L (ref 20–31)
Chloride: 100 mmol/L (ref 98–110)
Creat: 0.67 mg/dL (ref 0.50–1.10)
Glucose, Bld: 95 mg/dL (ref 65–99)
Potassium: 4.1 mmol/L (ref 3.5–5.3)
Sodium: 135 mmol/L (ref 135–146)
TOTAL PROTEIN: 8.2 g/dL — AB (ref 6.1–8.1)

## 2015-05-02 LAB — CBC WITH DIFFERENTIAL/PLATELET
Basophils Absolute: 0 10*3/uL (ref 0.0–0.1)
Basophils Relative: 0 % (ref 0–1)
Eosinophils Absolute: 0.1 10*3/uL (ref 0.0–0.7)
Eosinophils Relative: 1 % (ref 0–5)
HCT: 36.5 % (ref 36.0–46.0)
HEMOGLOBIN: 12.2 g/dL (ref 12.0–15.0)
Lymphocytes Relative: 23 % (ref 12–46)
Lymphs Abs: 2.1 10*3/uL (ref 0.7–4.0)
MCH: 27.1 pg (ref 26.0–34.0)
MCHC: 33.4 g/dL (ref 30.0–36.0)
MCV: 80.9 fL (ref 78.0–100.0)
MONOS PCT: 11 % (ref 3–12)
MPV: 8.7 fL (ref 8.6–12.4)
Monocytes Absolute: 1 10*3/uL (ref 0.1–1.0)
NEUTROS ABS: 5.9 10*3/uL (ref 1.7–7.7)
NEUTROS PCT: 65 % (ref 43–77)
Platelets: 492 10*3/uL — ABNORMAL HIGH (ref 150–400)
RBC: 4.51 MIL/uL (ref 3.87–5.11)
RDW: 17.1 % — ABNORMAL HIGH (ref 11.5–15.5)
WBC: 9.1 10*3/uL (ref 4.0–10.5)

## 2015-05-02 LAB — POCT URINALYSIS DIP (DEVICE)
BILIRUBIN URINE: NEGATIVE
Glucose, UA: NEGATIVE mg/dL
Ketones, ur: NEGATIVE mg/dL
Nitrite: NEGATIVE
Protein, ur: NEGATIVE mg/dL
Specific Gravity, Urine: 1.02 (ref 1.005–1.030)
Urobilinogen, UA: 0.2 mg/dL (ref 0.0–1.0)
pH: 5.5 (ref 5.0–8.0)

## 2015-05-02 MED ORDER — OXYCODONE HCL 5 MG PO TABS: 5.0000 mg | ORAL_TABLET | ORAL | Status: DC | PRN

## 2015-05-02 NOTE — Patient Instructions (Signed)
Give [place] a call on Tuesday 05/09/2015, if you have not received a call from the breast center.

## 2015-05-02 NOTE — Progress Notes (Signed)
Subjective:    Patient ID: Sabrina Short, female    DOB: Jul 30, 1990, 25 y.o.   MRN: [id number]  HPI [name], a student with a history of sickle cell anemia, HbSC presents for a post hospital follow-up. Patient was recently hospitalized for 6 days at [facility] for a sickle cell pain crisis. She attributes pain crisis to changes in weather. She currently denies increased pain. She states that pain dissipated 1 week ago.She also presents for an evaluation of bilateral breast masses. Change was noted several months ago. She also  reports that multiple breast masses were found on CT examination on 04/02/2015 while hospitalized at [facility].  She had a CT angiongram of the chest with and without contrast. , which revealed multiple bilateral mass. She maintains that attending recommended an outpatient follow-up for breast masses. She reports a history of breast cancer from maternal grandmother.Patient  routinely does self breast exams.  Patient has an IUD in place and reports that menses are irregular.  She currently reports fatigue. She denies fever, headache, chest pains, dysuria, nausea, vomiting, or diarrhea.  Past Medical History  Diagnosis Date  . Sickle cell disease ([place])   . 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 Acute chest syndrome due to sickle-cell disease ([place])   . Blood transfusion   . avascular necrosis of left shoulder (HCC)    There is no immunization history on file for this patient.  Allergies  Allergen Reactions  . Morphine And Related    Social History   Social History  . Marital Status: Single    Spouse Name: N/A  . Number of Children: N/A  . Years of Education: N/A   Occupational History  . Not on file.   Social History Main Topics  . Smoking status: Former Research scientist (life sciences)  . Smokeless tobacco: Never Used  . Alcohol Use: Yes     Comment: social  . Drug Use: No     Comment: occ  . Sexual Activity: Yes    Birth Control/ Protection:  IUD   Other Topics Concern  . Not on file   Social History Narrative   Review of Systems  Constitutional: Positive for fatigue.  HENT: Negative.   Eyes: Negative.   Respiratory: Negative.   Cardiovascular: Negative.   Gastrointestinal: Negative.   Endocrine: Negative for polydipsia, polyphagia and polyuria.  Genitourinary: Negative.   Musculoskeletal: Negative.   Skin: Negative.        Bilateral breast masses  Allergic/Immunologic: Negative for environmental allergies and food allergies.  Neurological: Negative.  Negative for dizziness and headaches.  Hematological: Negative.   Psychiatric/Behavioral: Negative.        Objective:   Physical Exam  Constitutional: She is oriented to person, place, and time. She appears well-developed and well-nourished.  HENT:  Head: Normocephalic and atraumatic.  Right Ear: External ear normal.  Left Ear: External ear normal.  Mouth/Throat: Oropharynx is clear and moist.  Eyes: Conjunctivae and EOM are normal. Pupils are equal, round, and reactive to light.  Cardiovascular: Normal rate, regular rhythm, normal heart sounds and intact distal pulses.   Pulmonary/Chest: Effort normal and breath sounds normal. Right breast exhibits mass and tenderness. Right breast exhibits no inverted nipple, no nipple discharge and no skin change. Left breast exhibits mass and tenderness. Left breast exhibits no inverted nipple, no nipple discharge and no skin change.    Abdominal: Soft. Bowel sounds are normal.  Musculoskeletal: Normal range of  motion.  Neurological: She is alert and oriented to person, place, and time. She has normal reflexes.  Skin: Skin is warm and dry.  Psychiatric: She has a normal mood and affect. Her behavior is normal. Judgment and thought content normal.      BP 113/90 mmHg  Pulse 105  Temp(Src) 98.3 F (36.8 C) (Oral)  Resp 16  Ht 5\' 2"  (1.575 m)  Wt 118 lb (53.524 kg)  BMI 21.58 kg/m2 Assessment & Plan:  1. Masses of both  breasts Left breast- 0.5 cm, round, movable, nodule to left lower inner, tender to palpation Right breast-  Several Round, movable, nodules to right upper outer and right lower outer  - MM Digital Diagnostic Bilat; Future - Prolactin  2. Hb-SS disease without crisis [facility]) We agreed on current opiate dosage. She uses opiate medications infrequently.  We discussed that pt is to receive her Schedule II prescriptions only from [place]. Pt is also aware that the prescription history is available to [place] online through the [facility] CSRS. Controlled substance agreement signed previously.  According to the [place] Chronic Pain Initiative program, we have reviewed details related to analgesia, adverse effects, aberrant behaviors. Reviewed [place] Substance Reporting system prior to prescribing opiate medications.   - oxyCODONE (OXY IR/ROXICODONE) 5 MG immediate release tablet; Take 1 tablet (5 mg total) by mouth every 4 (four) hours as needed for severe pain.  Dispense: 60 tablet; Refill: 0 - CBC with Differential - COMPLETE METABOLIC PANEL WITH GFR - POCT urinalysis dipstick  3. Other fatigue - TSH - CBC with Differential   RTC: Will follow up with patient by phone on 05/03/2015 to discuss laboratory results   The patient was given clear instructions to go to ER or return to medical center if symptoms do not improve, worsen or new problems develop. The patient verbalized understanding. Will notify patient with laboratory results.   Dorena Dew, FNP

## 2015-05-03 ENCOUNTER — Other Ambulatory Visit: Payer: Self-pay | Admitting: Family Medicine

## 2015-05-03 DIAGNOSIS — N6310 Unspecified lump in the right breast, unspecified quadrant: Secondary | ICD-10-CM

## 2015-05-03 DIAGNOSIS — N6320 Unspecified lump in the left breast, unspecified quadrant: Principal | ICD-10-CM

## 2015-05-03 LAB — PROLACTIN: Prolactin: 6 ng/mL

## 2015-05-03 LAB — TSH: TSH: 0.88 m[IU]/L

## 2015-05-05 ENCOUNTER — Other Ambulatory Visit: Payer: Self-pay | Admitting: Internal Medicine

## 2015-05-05 DIAGNOSIS — D571 Sickle-cell disease without crisis: Secondary | ICD-10-CM

## 2015-05-18 ENCOUNTER — Encounter: Payer: Self-pay | Admitting: Family Medicine

## 2015-05-18 ENCOUNTER — Telehealth: Payer: Self-pay | Admitting: *Deleted

## 2015-05-18 NOTE — Telephone Encounter (Signed)
[place] Please advise. Thanks!  

## 2015-05-18 NOTE — Telephone Encounter (Signed)
Pt states that she needs a work note faxed to her employer stating that she require bed rest while at home. Pt works in a office. Employer wants the note so when the pt can't make it to work and needs to work from home.  Her employer name is  [employer]  Fax# [phone]. Please advise provider. Thanks

## 2015-05-23 ENCOUNTER — Telehealth: Payer: Self-pay | Admitting: *Deleted

## 2015-05-23 DIAGNOSIS — D571 Sickle-cell disease without crisis: Secondary | ICD-10-CM

## 2015-05-23 NOTE — Telephone Encounter (Signed)
Pt called and needs a refill of her oxycodone . Please advise provider. Thanks

## 2015-05-23 NOTE — Telephone Encounter (Signed)
Refill request for Oxycodone LOV 05/02/2015. Please advise. Thanks!

## 2015-05-24 MED ORDER — OXYCODONE HCL 5 MG PO TABS: 5.0000 mg | ORAL_TABLET | ORAL | Status: DC | PRN

## 2015-05-24 NOTE — Telephone Encounter (Signed)
Reviewed  Substance Reporting system prior to prescribing opiate medications, no inconsistencies noted.   Meds ordered this encounter  Medications  . oxyCODONE (OXY IR/ROXICODONE) 5 MG immediate release tablet    Sig: Take 1 tablet (5 mg total) by mouth every 4 (four) hours as needed for severe pain.    Dispense:  60 tablet    Refill:  0    Order Specific Question:  Supervising Provider    Answer:  Tresa Garter [id number]     Dorena Dew, FNP

## 2015-05-26 ENCOUNTER — Ambulatory Visit
Admission: RE | Admit: 2015-05-26 | Discharge: 2015-05-26 | Disposition: A | Discharge status: Home / Self Care | Payer: Managed Care, Other (non HMO) | Source: Ambulatory Visit | Attending: Family Medicine | Admitting: Family Medicine

## 2015-05-26 DIAGNOSIS — N6320 Unspecified lump in the left breast, unspecified quadrant: Principal | ICD-10-CM

## 2015-05-26 DIAGNOSIS — N6310 Unspecified lump in the right breast, unspecified quadrant: Secondary | ICD-10-CM

## 2015-05-30 ENCOUNTER — Ambulatory Visit (INDEPENDENT_AMBULATORY_CARE_PROVIDER_SITE_OTHER): Payer: Managed Care, Other (non HMO) | Admitting: Family Medicine

## 2015-05-30 VITALS — BP 114/83 | HR 92 | Temp 98.4°F | Resp 16 | Ht 62.5 in | Wt 123.0 lb

## 2015-05-30 DIAGNOSIS — D242 Benign neoplasm of left breast: Secondary | ICD-10-CM

## 2015-05-30 DIAGNOSIS — N6310 Unspecified lump in the right breast, unspecified quadrant: Secondary | ICD-10-CM

## 2015-05-30 DIAGNOSIS — D571 Sickle-cell disease without crisis: Secondary | ICD-10-CM

## 2015-05-30 DIAGNOSIS — N63 Unspecified lump in breast: Secondary | ICD-10-CM | POA: Diagnosis not present

## 2015-05-30 DIAGNOSIS — N6320 Unspecified lump in the left breast, unspecified quadrant: Principal | ICD-10-CM

## 2015-05-30 DIAGNOSIS — Z803 Family history of malignant neoplasm of breast: Secondary | ICD-10-CM

## 2015-05-30 NOTE — Progress Notes (Signed)
Subjective:    Patient ID: Sabrina Short Short, female    DOB: 1990-06-16, 25 y.o.   MRN: [id number]  HPI Sabrina Short Short, a student with a history of sickle cell anemia, HbSC presents for 1 month follow up of sickle cell anemia. Patient reports that she feels well and has minimal complaints.  She reports mild pain to left shoulder. Current pain intensithy is 2/10 described as intermittent and aching. pain.  Sabrina Short Short was also sent for breast ultrasounds regarding masses to breasts bilaterally.  Change was noted several months ago. She also  reports that multiple breast masses were found on CT examination on 04/02/2015 while hospitalized at [facility].  She had a CT angiogram of the chest with and without contrast. , which revealed multiple bilateral mass. Ultrasound revealed a mass in the left breast at the 7 o'clock position that is greater than 5 cm. The breast mass is suggestive of a fibroadenoma. Patient is concerned about possible breast surgery.  She reports a history of breast cancer from maternal grandmother.Patient  routinely does self breast exams.  Patient has an IUD in place and reports that menses are irregular.  She currently reports fatigue. She denies fever, headache, chest pains, dysuria, nausea, vomiting, or diarrhea.  Past Medical History  Diagnosis Date  . Sickle cell disease ([place])   . 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 Acute chest syndrome due to sickle-cell disease ([place])   . Blood transfusion   . avascular necrosis of left shoulder (HCC)    There is no immunization history on file for this patient.  Allergies  Allergen Reactions  . Morphine And Related    Social History   Social History  . Marital Status: Single    Spouse Name: N/A  . Number of Children: N/A  . Years of Education: N/A   Occupational History  . Not on file.   Social History Main Topics  . Smoking status: Former Research scientist (life sciences)  . Smokeless tobacco: Never Used  . Alcohol Use: Yes     Comment:  social  . Drug Use: No     Comment: occ  . Sexual Activity: Yes    Birth Control/ Protection: IUD   Other Topics Concern  . Not on file   Social History Narrative   There is no immunization history on file for this patient. Review of Systems  Constitutional: Positive for fatigue.  HENT: Negative.   Eyes: Negative.   Respiratory: Negative.   Cardiovascular: Negative.   Gastrointestinal: Negative.   Endocrine: Negative for polydipsia, polyphagia and polyuria.  Genitourinary: Negative.   Musculoskeletal: Negative.   Skin: Negative.        Bilateral breast masses  Allergic/Immunologic: Negative for environmental allergies and food allergies.  Neurological: Negative.  Negative for dizziness and headaches.  Hematological: Negative.   Psychiatric/Behavioral: Negative.        Objective:   Physical Exam  Constitutional: She is oriented to person, place, and time. She appears well-developed and well-nourished.  HENT:  Head: Normocephalic and atraumatic.  Right Ear: External ear normal.  Left Ear: External ear normal.  Mouth/Throat: Oropharynx is clear and moist.  Eyes: Conjunctivae and EOM are normal. Pupils are equal, round, and reactive to light.  Cardiovascular: Normal rate, regular rhythm, normal heart sounds and intact distal pulses.   Pulmonary/Chest: Effort normal and breath sounds normal. Right breast exhibits mass and tenderness. Right breast exhibits no inverted nipple, no nipple discharge and no skin  change. Left breast exhibits mass and tenderness. Left breast exhibits no inverted nipple, no nipple discharge and no skin change.    Abdominal: Soft. Bowel sounds are normal.  Musculoskeletal: Normal range of motion.  Neurological: She is alert and oriented to person, place, and time. She has normal reflexes.  Skin: Skin is warm and dry.  Psychiatric: She has a normal mood and affect. Her behavior is normal. Judgment and thought content normal.      BP 114/83 mmHg   Pulse 92  Temp(Src) 98.4 F (36.9 C) (Oral)  Resp 16  Ht 5' 2.5" (1.588 m)  Wt 123 lb (55.792 kg)  BMI 22.12 kg/m2 Assessment & Plan:     1. Masses of both breasts Reviewed recent breast ultrasound, she has a large left breast mass at the 7 o'clock position, which is suggestive of a fibroadenoma measuring greater than 5 cm and is increasing in size. I will send a referral to a surgeon for further evaluation.   Left breast- 5 cm, round, movable, nodule to left lower inner, tender to palpation  Right breast-  Several Round, movable, nodules to right upper outer and right lower outer - Ambulatory referral to General Surgery  2. Fibroadenoma of left breast  - Ambulatory referral to General Surgery  3. Family History of breast cancer in first degree relative Sabrina Short Short and I discussed genetic testing at length.   4. Hb-SS disease without crisis ([place])  Continue folic acid 1 mg daily to prevent aplastic bone marrow crises.   Pulmonary evaluation - Patient denies severe recurrent wheezes, shortness of breath with exercise, or persistent cough. If these symptoms develop, pulmonary function tests with spirometry will be ordered, and if abnormal, plan on referral to Pulmonology for further evaluation.  Cardiac - Routine screening for pulmonary hypertension is not recommended.  Eye - High risk of proliferative retinopathy. Annual eye exam with retinal exam recommended to patient. She is up to date with eye examination  Immunization status - She will bring her immunization record for review   Acute and chronic painful episodes - Patient uses opiate medication minimally for occasional pain related to sickle cell anemia.  We discussed that pt is to receive her Schedule II prescriptions only from [place]. Pt is also aware that the prescription history is available to [place] online through the [facility] CSRS. Controlled substance agreement signed (Date). We reminded (Pt) that all patients receiving Schedule II  narcotics must be seen for follow within one month of prescription being requested. We reviewed the terms of our pain agreement, including the need to keep medicines in a safe locked location away from children or pets, and the need to report excess sedation or constipation, measures to avoid constipation, and policies related to early refills and stolen prescriptions. According to the [place] [facility] program, we have reviewed details related to analgesia, adverse effects, aberrant behaviors.   RTC: Follow up in 3 months for sickle cell anemia   The patient was given clear instructions to go to ER or return to medical center if symptoms do not improve, worsen or new problems develop. The patient verbalized understanding.      Dorena Dew, FNP

## 2015-05-31 ENCOUNTER — Encounter: Payer: Self-pay | Admitting: Family Medicine

## 2015-05-31 DIAGNOSIS — Z803 Family history of malignant neoplasm of breast: Secondary | ICD-10-CM | POA: Insufficient documentation

## 2015-05-31 DIAGNOSIS — D242 Benign neoplasm of left breast: Secondary | ICD-10-CM | POA: Insufficient documentation

## 2015-05-31 DIAGNOSIS — N6320 Unspecified lump in the left breast, unspecified quadrant: Principal | ICD-10-CM

## 2015-05-31 DIAGNOSIS — N6310 Unspecified lump in the right breast, unspecified quadrant: Secondary | ICD-10-CM | POA: Insufficient documentation

## 2015-06-08 ENCOUNTER — Telehealth: Payer: Self-pay

## 2015-06-08 DIAGNOSIS — D571 Sickle-cell disease without crisis: Secondary | ICD-10-CM

## 2015-06-08 NOTE — Telephone Encounter (Signed)
Pt called and requested a refill on prescription, oxycodone. Thanks!

## 2015-06-08 NOTE — Telephone Encounter (Signed)
Refill request for oxycodone. LOV 05/30/2015. Please advise. Thanks!

## 2015-06-09 MED ORDER — OXYCODONE HCL 5 MG PO TABS: 5.0000 mg | ORAL_TABLET | ORAL | Status: DC | PRN

## 2015-06-09 NOTE — Telephone Encounter (Signed)
Reviewed [place] Substance Reporting system prior to prescribing opiate medication.   Meds ordered this encounter  Medications  . oxyCODONE (OXY IR/ROXICODONE) 5 MG immediate release tablet    Sig: Take 1 tablet (5 mg total) by mouth every 4 (four) hours as needed for severe pain.    Dispense:  60 tablet    Refill:  0    Order Specific Question:  Supervising Provider    Answer:  Tresa Garter [id number]    Dorena Dew, FNP

## 2015-06-12 ENCOUNTER — Ambulatory Visit: Payer: Self-pay | Admitting: General Surgery

## 2015-06-12 NOTE — H&P (Signed)
Sabrina Short 06/12/2015 3:44 PM Location: [facility] Patient #: [id number] DOB: 18-Feb-1991 Single / Language: Cleophus Molt / Race: Black or African American Female  History of Present Illness Odis Hollingshead MD; 06/12/2015 4:23 PM) The patient is a 25 year old female.   Note:She is referred by Cammie Sickle, FNP for consultation regarding bilateral breast masses. She has been aware of a left breast mass for approximately one year. This is in the 7 o'clock position. She states she feel feels this one is getting larger. She has a significant daily caffeine intake. Ultrasound was performed and the results are below. Her mother is here with her and had something similar to this when she was younger. She stopped her caffeine intake and did not have to have any further treatment.  She has sickle cell anemia. Her last hemoglobin was 12.  EXAM: ULTRASOUND OF THE BILATERAL BREAST  COMPARISON: None.  FINDINGS: Physical examination of the outer right breast at site of palpable concern reveals a firm highly mobile mass at the approximate 10 o'clock position. Physical examination of the upper-outer left breast reveals thickening at the approximate 1 o'clock position. Physical examination of the lower inner left breast reveals a very large firm highly mobile mass.  Targeted ultrasound of the right breast was performed demonstrating an oval well-circumscribed hypoechoic mass at 10 o'clock 4 cm from the nipple measuring 2.1 x 1.3 x 2 cm. This corresponds with the palpable abnormality in the upper-outer right breast. In addition there is a smaller similar-appearing oval circumscribed hypoechoic masses 634 cm from the nipple measuring 1.6 x 0.6 x 1.4 cm. 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  Targeted ultrasound of the upper-outer left breast demonstrates a cluster of cysts at 1 o'clock 4 cm from the nipple measuring 1 x 0.5 x 1.1 cm. There is a large  well-circumscribed oval hypoechoic mass in the left breast at 7 o'clock 4 cm from nipple measuring 5.1 x 2.5 x 5.1 cm. The patient states she has been feeling this mass for many years, however feels as if it is increasing in size. Note that this mass was incorrectly labeled and is actually at the 7 o'clock position in the left breast.  IMPRESSION: 1. Probably benign right breast masses.  2. Cluster of cysts in the upper-outer left breast.  3. Large left breast mass at the 7 o'clock position which demonstrates imaging features suggestive of a fibroadenoma, however the patient states this has been increasing in size and measures over 5 cm.  RECOMMENDATION: 1. Six-month follow-up right breast ultrasound for the probably benign masses in the right breast.  2. Recommend referral to a breast surgeon for the mass in the left breast in the 7 o'clock position that measures greater than 5 cm and is increasing in size per the patient. The patient states she would like to discuss this with her primary physician the 4 making an appointment to see a surgeon.  Other Problems Jeralyn Ruths, [place]; 06/12/2015 4:15 PM) Anxiety Disorder Cholelithiasis Sickle cell disease Transfusion history  Past Surgical History Jeralyn Ruths, [place]; 06/12/2015 4:15 PM) Gallbladder Surgery - Laparoscopic Oral Surgery  Diagnostic Studies History Jeralyn Ruths, [place]; 06/12/2015 4:15 PM) Colonoscopy never Mammogram never Pap Smear 1-5 years ago  Allergies Marjean Donna, CMA; 06/12/2015 3:45 PM) Morphine Sulfate (Concentrate) *ANALGESICS - OPIOID*  Medication History Marjean Donna, CMA; 06/12/2015 3:45 PM) Tylenol Extra Strength (500MG  Tablet, Oral) Active. BuSpar (10MG  Tablet, Oral) Active. Vitamin D (Ergocalciferol) (50000UNIT Capsule, Oral) Active. Folic Acid (  1MG  Tablet, Oral) Active. Ibuprofen (600MG  Tablet, Oral) Active. Loratadine (10MG  Tablet, Oral) Active. Multiple Vitamin (1 (one)  Oral) Active. Medications Reconciled  Pregnancy / Birth History Jeralyn Ruths, [place]; 06/12/2015 4:15 PM) Age at menarche 60 years. Contraceptive History Intrauterine device. Gravida 0 Irregular periods Para 0     Review of Systems Jearld Fenton Morris CMA; 06/12/2015 4:15 PM) General Not Present- Appetite Loss, Chills, Fatigue, Fever, Night Sweats, Weight Gain and Weight Loss. Skin Not Present- Change in Wart/Mole, Dryness, Hives, Jaundice, New Lesions, Non-Healing Wounds, Rash and Ulcer. HEENT Present- Seasonal Allergies. Not Present- Earache, Hearing Loss, Hoarseness, Nose Bleed, Oral Ulcers, Ringing in the Ears, Sinus Pain, Sore Throat, Visual Disturbances, Wears glasses/contact lenses and Yellow Eyes. Breast Present- Breast Mass. Not Present- Breast Pain, Nipple Discharge and Skin Changes. Cardiovascular Not Present- Chest Pain, Difficulty Breathing Lying Down, Leg Cramps, Palpitations, Rapid Heart Rate, Shortness of Breath and Swelling of Extremities. Gastrointestinal Not Present- Abdominal Pain, Bloating, Bloody Stool, Change in Bowel Habits, Chronic diarrhea, Constipation, Difficulty Swallowing, Excessive gas, Gets full quickly at meals, Hemorrhoids, Indigestion, Nausea, Rectal Pain and Vomiting. Female Genitourinary Present- Frequency. Not Present- Nocturia, Painful Urination, Pelvic Pain and Urgency. Musculoskeletal Not Present- Back Pain, Joint Pain, Joint Stiffness, Muscle Pain, Muscle Weakness and Swelling of Extremities. Neurological Not Present- Decreased Memory, Fainting, Headaches, Numbness, Seizures, Tingling, Tremor, Trouble walking and Weakness. Psychiatric Not Present- Anxiety, Bipolar, Change in Sleep Pattern, Depression, Fearful and Frequent crying. Endocrine Not Present- Cold Intolerance, Excessive Hunger, Hair Changes, Heat Intolerance, Hot flashes and New Diabetes. Hematology Not Present- Easy Bruising, Excessive bleeding, Gland problems, HIV and Persistent  Infections.  Vitals (Sonya Bynum CMA; 06/12/2015 3:45 PM) 06/12/2015 3:44 PM Weight: 119 lb Height: 62in Body Surface Area: 1.53 m Body Mass Index: 21.77 kg/m  Temp.: 24F(Temporal)  Pulse: 78 (Regular)  BP: 122/70 (Sitting, Left Arm, Standard)      Physical Exam Odis Hollingshead MD; 06/12/2015 4:25 PM)  The physical exam findings are as follows: Note:General: WDWN in NAD. Pleasant and cooperative.  EYES: EOMI, no icterus  CV: RRR, no murmur, no JVD.  CHEST: Breath sounds equal and clear. Respirations nonlabored.  BREASTS: Symmetrical in size. There are 4 palpable masses in the left breast. There is a 5 cm mobile mass at the 7 o'clock position. This mass is tenting up the skin. There is a 2.5 cm mobile mass at the 10 o'clock position abutting the sternum. There is a 2.5 cm mass 1 o'clock position that is mobile in the axillary area. There is a 2.5 cm mass 4 o'clock position that is mobile.  There are 2 palpable masses in the right breast. There is a 2.5 cm mass that is mobile to 10 o'clock position. There is a 1 cm mass palpable at the 6 o'clock position. There are multiple other irregularities present.  ABDOMEN: Soft, nontender, well-healed small upper abdominal scars.  NEUROLOGIC: Alert and oriented, answers questions appropriately, normal gait and station.  PSYCHIATRIC: Normal mood, affect , and behavior.    Assessment & Plan Odis Hollingshead MD; 06/12/2015 4:29 PM)  MASSES OF BOTH BREASTS (N63) Impression: She has 4 masses in the left breast. She has 2 masses in the right breast. The largest mass in the left breast is 5 cm and has been growing. It is at the 7 o'clock position. Her hemoglobin was 12.2 approximately 5 weeks ago.  Plan: Avoid caffeine. Removal of 7:00 left breast mass. We will then monitor the other masses and if they get  larger, we will schedule them for removal as well. I have explained the procedure, risks, and aftercare to her. Risks  include but are not limited to bleeding, infection, wound problems, cosmetic deformity, anesthesia. She seems to [facility] and agrees with the plan.  Jackolyn Confer, MD

## 2015-06-17 DIAGNOSIS — N6320 Unspecified lump in the left breast, unspecified quadrant: Secondary | ICD-10-CM

## 2015-06-17 HISTORY — DX: Unspecified lump in the left breast, unspecified quadrant: N63.20

## 2015-06-22 ENCOUNTER — Encounter (HOSPITAL_BASED_OUTPATIENT_CLINIC_OR_DEPARTMENT_OTHER): Payer: Self-pay | Admitting: *Deleted

## 2015-06-22 NOTE — Pre-Procedure Instructions (Signed)
History of sickle cell disease and current crisis episode (06/22/2015) discussed with Dr. Smith Robert; pt. needs to be seen by hematology and cleared for surgery before she can be done at [facility].  Wendy at [employer] and pt. notified of same.

## 2015-06-22 NOTE — Pre-Procedure Instructions (Signed)
To come for CBC, diff

## 2015-06-27 ENCOUNTER — Encounter: Payer: Self-pay | Admitting: Family Medicine

## 2015-06-28 ENCOUNTER — Encounter (HOSPITAL_BASED_OUTPATIENT_CLINIC_OR_DEPARTMENT_OTHER)
Admission: RE | Admit: 2015-06-28 | Discharge: 2015-06-28 | Disposition: A | Discharge status: Home / Self Care | Payer: Managed Care, Other (non HMO) | Source: Ambulatory Visit | Attending: General Surgery | Admitting: General Surgery

## 2015-06-28 DIAGNOSIS — D571 Sickle-cell disease without crisis: Secondary | ICD-10-CM | POA: Diagnosis not present

## 2015-06-28 DIAGNOSIS — Z87891 Personal history of nicotine dependence: Secondary | ICD-10-CM | POA: Diagnosis not present

## 2015-06-28 DIAGNOSIS — F419 Anxiety disorder, unspecified: Secondary | ICD-10-CM | POA: Diagnosis not present

## 2015-06-28 DIAGNOSIS — D242 Benign neoplasm of left breast: Secondary | ICD-10-CM | POA: Diagnosis present

## 2015-06-28 LAB — CBC WITH DIFFERENTIAL/PLATELET
Basophils Absolute: 0 10*3/uL (ref 0.0–0.1)
Basophils Relative: 0 %
EOS ABS: 0.1 10*3/uL (ref 0.0–0.7)
Eosinophils Relative: 1 %
HCT: 33 % — ABNORMAL LOW (ref 36.0–46.0)
HEMOGLOBIN: 11.8 g/dL — AB (ref 12.0–15.0)
LYMPHS ABS: 2.6 10*3/uL (ref 0.7–4.0)
LYMPHS PCT: 25 %
MCH: 25.4 pg — AB (ref 26.0–34.0)
MCHC: 35.8 g/dL (ref 30.0–36.0)
MCV: 71 fL — AB (ref 78.0–100.0)
MONOS PCT: 13 %
Monocytes Absolute: 1.3 10*3/uL — ABNORMAL HIGH (ref 0.1–1.0)
NEUTROS PCT: 61 %
Neutro Abs: 6.2 10*3/uL (ref 1.7–7.7)
Platelets: 505 10*3/uL — ABNORMAL HIGH (ref 150–400)
RBC: 4.65 MIL/uL (ref 3.87–5.11)
RDW: 17.8 % — ABNORMAL HIGH (ref 11.5–15.5)
WBC: 10.2 10*3/uL (ref 4.0–10.5)

## 2015-06-28 NOTE — Progress Notes (Signed)
Notified Dr. Delma Post of CBC and Diff - ok for surgery

## 2015-06-29 ENCOUNTER — Encounter (HOSPITAL_BASED_OUTPATIENT_CLINIC_OR_DEPARTMENT_OTHER): Payer: Self-pay | Admitting: *Deleted

## 2015-06-29 ENCOUNTER — Ambulatory Visit (HOSPITAL_BASED_OUTPATIENT_CLINIC_OR_DEPARTMENT_OTHER)
Admission: RE | Admit: 2015-06-29 | Discharge: 2015-06-29 | Disposition: A | Discharge status: Home / Self Care | Payer: Managed Care, Other (non HMO) | Source: Ambulatory Visit | Attending: General Surgery | Admitting: General Surgery

## 2015-06-29 ENCOUNTER — Ambulatory Visit (HOSPITAL_BASED_OUTPATIENT_CLINIC_OR_DEPARTMENT_OTHER): Payer: Managed Care, Other (non HMO) | Admitting: Anesthesiology

## 2015-06-29 ENCOUNTER — Encounter (HOSPITAL_BASED_OUTPATIENT_CLINIC_OR_DEPARTMENT_OTHER)
Admission: RE | Disposition: A | Discharge status: Home / Self Care | Payer: Self-pay | Source: Ambulatory Visit | Attending: General Surgery

## 2015-06-29 DIAGNOSIS — D242 Benign neoplasm of left breast: Secondary | ICD-10-CM | POA: Diagnosis not present

## 2015-06-29 DIAGNOSIS — F419 Anxiety disorder, unspecified: Secondary | ICD-10-CM | POA: Insufficient documentation

## 2015-06-29 DIAGNOSIS — Z87891 Personal history of nicotine dependence: Secondary | ICD-10-CM | POA: Insufficient documentation

## 2015-06-29 DIAGNOSIS — D571 Sickle-cell disease without crisis: Secondary | ICD-10-CM | POA: Insufficient documentation

## 2015-06-29 HISTORY — DX: Personal history of other medical treatment: Z92.89

## 2015-06-29 HISTORY — DX: Pain in unspecified joint: M25.50

## 2015-06-29 HISTORY — DX: Other chronic pain: G89.29

## 2015-06-29 HISTORY — PX: BREAST BIOPSY: SHX20

## 2015-06-29 HISTORY — DX: Unspecified lump in the left breast, unspecified quadrant: N63.20

## 2015-06-29 SURGERY — BREAST BIOPSY
Anesthesia: General | Site: Breast | Laterality: Left

## 2015-06-29 MED ORDER — LACTATED RINGERS IV SOLN
INTRAVENOUS | Status: DC
Start: 2015-06-29 — End: 2015-06-29
  Administered 2015-06-29: 12:00:00 via INTRAVENOUS

## 2015-06-29 MED ORDER — PROPOFOL 10 MG/ML IV BOLUS
INTRAVENOUS | Status: DC | PRN
  Administered 2015-06-29: 150 mg via INTRAVENOUS

## 2015-06-29 MED ORDER — DEXAMETHASONE SODIUM PHOSPHATE 4 MG/ML IJ SOLN
INTRAMUSCULAR | Status: DC | PRN
  Administered 2015-06-29: 10 mg via INTRAVENOUS

## 2015-06-29 MED ORDER — FENTANYL CITRATE (PF) 100 MCG/2ML IJ SOLN
25.0000 ug | INTRAMUSCULAR | Status: DC | PRN
  Administered 2015-06-29 (×2): 25 ug via INTRAVENOUS
  Administered 2015-06-29: 50 ug via INTRAVENOUS

## 2015-06-29 MED ORDER — PROPOFOL 10 MG/ML IV BOLUS
INTRAVENOUS | Status: AC
  Filled 2015-06-29: qty 20

## 2015-06-29 MED ORDER — ONDANSETRON HCL 4 MG/2ML IJ SOLN
INTRAMUSCULAR | Status: DC | PRN
  Administered 2015-06-29: 4 mg via INTRAVENOUS

## 2015-06-29 MED ORDER — CEFAZOLIN SODIUM-DEXTROSE 2-4 GM/100ML-% IV SOLN
2.0000 g | INTRAVENOUS | Status: AC
  Administered 2015-06-29: 2 g via INTRAVENOUS

## 2015-06-29 MED ORDER — FENTANYL CITRATE (PF) 100 MCG/2ML IJ SOLN
50.0000 ug | INTRAMUSCULAR | Status: AC | PRN
  Administered 2015-06-29: 50 ug via INTRAVENOUS
  Administered 2015-06-29: 100 ug via INTRAVENOUS
  Administered 2015-06-29: 50 ug via INTRAVENOUS

## 2015-06-29 MED ORDER — BUPIVACAINE HCL (PF) 0.5 % IJ SOLN
INTRAMUSCULAR | Status: AC
  Filled 2015-06-29: qty 30

## 2015-06-29 MED ORDER — SODIUM CHLORIDE 0.9 % IJ SOLN
INTRAMUSCULAR | Status: AC
  Filled 2015-06-29: qty 30

## 2015-06-29 MED ORDER — MIDAZOLAM HCL 2 MG/2ML IJ SOLN
INTRAMUSCULAR | Status: AC
  Filled 2015-06-29: qty 2

## 2015-06-29 MED ORDER — FENTANYL CITRATE (PF) 100 MCG/2ML IJ SOLN
INTRAMUSCULAR | Status: AC
  Filled 2015-06-29: qty 2

## 2015-06-29 MED ORDER — GLYCOPYRROLATE 0.2 MG/ML IJ SOLN: 0.2000 mg | Freq: Once | INTRAMUSCULAR | Status: DC | PRN

## 2015-06-29 MED ORDER — OXYCODONE HCL 5 MG PO TABS: 5.0000 mg | ORAL_TABLET | ORAL | Status: DC | PRN

## 2015-06-29 MED ORDER — KETOROLAC TROMETHAMINE 30 MG/ML IJ SOLN
INTRAMUSCULAR | Status: DC | PRN
  Administered 2015-06-29: 30 mg via INTRAVENOUS

## 2015-06-29 MED ORDER — LIDOCAINE HCL (PF) 1 % IJ SOLN
INTRAMUSCULAR | Status: AC
  Filled 2015-06-29: qty 30

## 2015-06-29 MED ORDER — CEFAZOLIN SODIUM-DEXTROSE 2-4 GM/100ML-% IV SOLN
INTRAVENOUS | Status: AC
  Filled 2015-06-29: qty 100

## 2015-06-29 MED ORDER — OXYCODONE HCL 5 MG PO TABS
5.0000 mg | ORAL_TABLET | Freq: Once | ORAL | Status: AC
  Administered 2015-06-29: 5 mg via ORAL

## 2015-06-29 MED ORDER — MIDAZOLAM HCL 2 MG/2ML IJ SOLN
1.0000 mg | INTRAMUSCULAR | Status: DC | PRN
  Administered 2015-06-29: 2 mg via INTRAVENOUS

## 2015-06-29 MED ORDER — OXYCODONE HCL 5 MG PO TABS
ORAL_TABLET | ORAL | Status: AC
  Filled 2015-06-29: qty 1

## 2015-06-29 MED ORDER — PROMETHAZINE HCL 25 MG/ML IJ SOLN: 6.2500 mg | INTRAMUSCULAR | Status: DC | PRN

## 2015-06-29 MED ORDER — BUPIVACAINE HCL (PF) 0.5 % IJ SOLN
INTRAMUSCULAR | Status: DC | PRN
  Administered 2015-06-29: 9.5 mL

## 2015-06-29 MED ORDER — SCOPOLAMINE 1 MG/3DAYS TD PT72: 1.0000 | MEDICATED_PATCH | Freq: Once | TRANSDERMAL | Status: DC | PRN

## 2015-06-29 MED ORDER — LIDOCAINE HCL (CARDIAC) 20 MG/ML IV SOLN
INTRAVENOUS | Status: DC | PRN
  Administered 2015-06-29: 50 mg via INTRAVENOUS

## 2015-06-29 SURGICAL SUPPLY — 43 items
BENZOIN TINCTURE PRP APPL 2/3 (GAUZE/BANDAGES/DRESSINGS) ×3 IMPLANT
BINDER BREAST MEDIUM (GAUZE/BANDAGES/DRESSINGS) ×3 IMPLANT
BLADE SURG 15 STRL LF DISP TIS (BLADE) ×1 IMPLANT
BLADE SURG 15 STRL SS (BLADE) ×2
CANISTER SUCT 1200ML W/VALVE (MISCELLANEOUS) IMPLANT
CHLORAPREP W/TINT 26ML (MISCELLANEOUS) ×3 IMPLANT
CLOSURE WOUND 1/2 X4 (GAUZE/BANDAGES/DRESSINGS) ×1
COVER BACK TABLE 60X90IN (DRAPES) ×3 IMPLANT
COVER MAYO STAND STRL (DRAPES) ×3 IMPLANT
DECANTER SPIKE VIAL GLASS SM (MISCELLANEOUS) IMPLANT
DEVICE DUBIN W/COMP PLATE 8390 (MISCELLANEOUS) IMPLANT
DRAPE LAPAROTOMY 100X72 PEDS (DRAPES) ×3 IMPLANT
DRAPE UTILITY XL STRL (DRAPES) ×3 IMPLANT
ELECT COATED BLADE 2.86 ST (ELECTRODE) ×3 IMPLANT
ELECT REM PT RETURN 9FT ADLT (ELECTROSURGICAL) ×3
ELECTRODE REM PT RTRN 9FT ADLT (ELECTROSURGICAL) ×1 IMPLANT
GAUZE SPONGE 4X4 12PLY STRL (GAUZE/BANDAGES/DRESSINGS) ×3 IMPLANT
GLOVE BIOGEL PI IND STRL 8 (GLOVE) ×1 IMPLANT
GLOVE BIOGEL PI IND STRL 8.5 (GLOVE) ×1 IMPLANT
GLOVE BIOGEL PI INDICATOR 8 (GLOVE) ×2
GLOVE BIOGEL PI INDICATOR 8.5 (GLOVE) ×2
GLOVE ECLIPSE 8.0 STRL XLNG CF (GLOVE) ×3 IMPLANT
GLOVE EXAM NITRILE EXT CUFF MD (GLOVE) ×3 IMPLANT
GLOVE SURG SS PI 7.5 STRL IVOR (GLOVE) ×3 IMPLANT
GOWN STRL REUS W/ TWL LRG LVL3 (GOWN DISPOSABLE) ×2 IMPLANT
GOWN STRL REUS W/TWL LRG LVL3 (GOWN DISPOSABLE) ×4
NEEDLE HYPO 25X1 1.5 SAFETY (NEEDLE) ×3 IMPLANT
NS IRRIG 1000ML POUR BTL (IV SOLUTION) ×3 IMPLANT
PACK BASIN DAY SURGERY FS (CUSTOM PROCEDURE TRAY) ×3 IMPLANT
PENCIL BUTTON HOLSTER BLD 10FT (ELECTRODE) ×3 IMPLANT
SLEEVE SCD COMPRESS KNEE MED (MISCELLANEOUS) ×3 IMPLANT
SPONGE GAUZE 4X4 12PLY STER LF (GAUZE/BANDAGES/DRESSINGS) IMPLANT
STRIP CLOSURE SKIN 1/2X4 (GAUZE/BANDAGES/DRESSINGS) ×2 IMPLANT
SUT MNCRL AB 3-0 PS2 18 (SUTURE) ×3 IMPLANT
SUT MON AB 4-0 PC3 18 (SUTURE) ×3 IMPLANT
SUT SILK 2 0 FS (SUTURE) ×3 IMPLANT
SUT VICRYL 3-0 CR8 SH (SUTURE) ×3 IMPLANT
SYR CONTROL 10ML LL (SYRINGE) ×3 IMPLANT
TOWEL OR 17X24 6PK STRL BLUE (TOWEL DISPOSABLE) ×3 IMPLANT
TOWEL OR NON WOVEN STRL DISP B (DISPOSABLE) IMPLANT
TUBE CONNECTING 20'X1/4 (TUBING)
TUBE CONNECTING 20X1/4 (TUBING) IMPLANT
YANKAUER SUCT BULB TIP NO VENT (SUCTIONS) IMPLANT

## 2015-06-29 NOTE — Op Note (Signed)
Operative Note  Sabrina Short female 25 y.o. 06/29/2015  PREOPERATIVE DX:  Enlarging left breast mass clinically consistent with fibroadenoma  POSTOPERATIVE DX:  Same  PROCEDURE:   Excision of 6 cm enlarging left breast mass         Surgeon: Odis Hollingshead   Assistants: none  Anesthesia: General LMA anesthesia  Indications:   This is a 25 year old female who has known fibroadenoma the breast. She has multiple masses in the left breast and small ones in the right breast. In the 7:00 position there is a enlarging mass that measured 5 cm in the office. She now presents for excision of the enlarging left breast mass.    Procedure Detail:  She was seen in the holding area. Left breast was marked my initials. She is brought to the operating room and placed supine on the operating table and a general anesthetic was given. Left breast was sterilely prepped and draped. A timeout was performed.  I manipulated the left breast mass which appeared larger than it was in the office. I contemplated using a circumareolar incision but felt I would not be able to get the mass out through the incision given its size. Subsequently I decided do an inframammary incision. Local anesthetic was infiltrated in the left inframammary area. Inframammary incision is made through the skin and subcutaneous tissue. I put traction on the mass and was able to use dissection to deliver through the inferior aspect of the wound and excise it. It measured 6 cm long by 5.5 cm wide by 3 cm in height.the mass was sent to pathology.  I inspected the wound and controlled bleeding with electrocautery. Local anesthetic consisting of 0.5% plain Marcaine was infiltrated into the wound. I then approximated the subcutaneous tissues with interrupted 3-0 Vicryl suture. The skin is closed with a running 3-0 Monocryl subcuticular stitch. Steri-Strips and a sterile dressing were applied. A breast binder was applied.  She tolerated the  procedure without apparent complications. She was taken recovery room in satisfactory condition.   Estimated Blood Loss:  less than 100 mL                Specimens: left breast mass        Complications:  * No complications entered in OR log *         Disposition: PACU - hemodynamically stable.         Condition: stable

## 2015-06-29 NOTE — Anesthesia Preprocedure Evaluation (Addendum)
Anesthesia Evaluation  Patient identified by MRN, date of birth, ID band Patient awake    Reviewed: Allergy & Precautions, NPO status , Patient's Chart, lab work & pertinent test results  Airway Mallampati: II  TM Distance: >3 FB Neck ROM: Full    Dental  (+) Teeth Intact, Dental Advisory Given   Pulmonary former smoker,    Pulmonary exam normal breath sounds clear to auscultation       Cardiovascular Exercise Tolerance: Good negative cardio ROS Normal cardiovascular exam Rhythm:Regular Rate:Normal     Neuro/Psych PSYCHIATRIC DISORDERS Anxiety negative neurological ROS     GI/Hepatic negative GI ROS, Neg liver ROS,   Endo/Other  negative endocrine ROS  Renal/GU negative Renal ROS     Musculoskeletal negative musculoskeletal ROS (+) Left AVN hip    Abdominal   Peds  Hematology  (+) Blood dyscrasia (Sickle cell crisis 1 week ago), Sickle cell anemia and anemia ,   Anesthesia Other Findings Day of surgery medications reviewed with the patient.  Left breast mass   Reproductive/Obstetrics negative OB ROS                           Anesthesia Physical Anesthesia Plan  ASA: III  Anesthesia Plan: General   Post-op Pain Management:    Induction: Intravenous  Airway Management Planned: LMA  Additional Equipment:   Intra-op Plan:   Post-operative Plan: Extubation in OR  Informed Consent: I have reviewed the patients History and Physical, chart, labs and discussed the procedure including the risks, benefits and alternatives for the proposed anesthesia with the patient or authorized representative who has indicated his/her understanding and acceptance.   Dental advisory given  Plan Discussed with: CRNA  Anesthesia Plan Comments: (Risks/benefits of general anesthesia discussed with patient including risk of damage to teeth, lips, gum, and tongue, nausea/vomiting, allergic reactions to  medications, and the possibility of heart attack, stroke and death.  All patient questions answered.  Patient wishes to proceed.)       Anesthesia Quick Evaluation

## 2015-06-29 NOTE — Interval H&P Note (Signed)
History and Physical Interval Note:  06/29/2015 1:03 PM  Sabrina Short  has presented today for surgery, with the diagnosis of left breast mass  The various methods of treatment have been discussed with the patient and family. After consideration of risks, benefits and other options for treatment, the patient has consented to  Procedure(s): REMOVE LEFT BREAST MASS (Left) as a surgical intervention .  The patient's history has been reviewed, patient examined, no change in status, stable for surgery.  I have reviewed the patient's chart and labs.  Questions were answered to the patient's satisfaction.     Sabrina Short

## 2015-06-29 NOTE — Anesthesia Postprocedure Evaluation (Signed)
Anesthesia Post Note  Patient: Sabrina Short  Procedure(s) Performed: Procedure(s) (LRB): REMOVE LEFT BREAST MASS (Left)  Patient location during evaluation: PACU Anesthesia Type: General Level of consciousness: awake and alert Pain management: pain level controlled Vital Signs Assessment: post-procedure vital signs reviewed and stable Respiratory status: spontaneous breathing, nonlabored ventilation, respiratory function stable and patient connected to nasal cannula oxygen Cardiovascular status: blood pressure returned to baseline and stable Postop Assessment: no signs of nausea or vomiting Anesthetic complications: no    Last Vitals:  Filed Vitals:   06/29/15 1430 06/29/15 1445  BP: 119/92 119/83  Pulse: 97 91  Temp:    Resp: 20 16    Last Pain:  Filed Vitals:   06/29/15 1455  PainSc: 6                  Catalina Gravel

## 2015-06-29 NOTE — Anesthesia Procedure Notes (Signed)
Procedure Name: LMA Insertion Date/Time: 06/29/2015 1:21 PM Performed by: Maryella Shivers Pre-anesthesia Checklist: Patient identified, Emergency Drugs available, Suction available and Patient being monitored Patient Re-evaluated:Patient Re-evaluated prior to inductionOxygen Delivery Method: Circle System Utilized Preoxygenation: Pre-oxygenation with 100% oxygen Intubation Type: IV induction Ventilation: Mask ventilation without difficulty LMA: LMA inserted LMA Size: 4.0 Number of attempts: 1 Airway Equipment and Method: Bite block Placement Confirmation: positive ETCO2 Tube secured with: Tape Dental Injury: Teeth and Oropharynx as per pre-operative assessment

## 2015-06-29 NOTE — Transfer of Care (Signed)
Immediate Anesthesia Transfer of Care Note  Patient: Sabrina Short  Procedure(s) Performed: Procedure(s): REMOVE LEFT BREAST MASS (Left)  Patient Location: PACU  Anesthesia Type:General  Level of Consciousness: sedated  Airway & Oxygen Therapy: Patient Spontanous Breathing and Patient connected to face mask oxygen  Post-op Assessment: Report given to RN and Post -op Vital signs reviewed and stable  Post vital signs: Reviewed and stable  Last Vitals:  Filed Vitals:   06/29/15 1411 06/29/15 1413  BP:  116/91  Pulse: 93 109  Temp:    Resp:  10    Complications: No apparent anesthesia complications

## 2015-06-29 NOTE — Discharge Instructions (Signed)
[facility] Office Phone Number [phone]  BREAST BIOPSY/ PARTIAL MASTECTOMY: POST OP INSTRUCTIONS  Always review your discharge instruction sheet given to you by the facility where your surgery was performed.  IF YOU HAVE DISABILITY OR FAMILY LEAVE FORMS, YOU MUST BRING THEM TO THE OFFICE FOR PROCESSING.  DO NOT GIVE THEM TO YOUR DOCTOR.  1. A prescription for pain medication may be given to you upon discharge.  Take your pain medication as prescribed, if needed.  If narcotic pain medicine is not needed, then you may take acetaminophen (Tylenol) or ibuprofen (Advil) as needed. 2. Take your usually prescribed medications unless otherwise directed 3. If you need a refill on your pain medication, please contact your pharmacy.  They will contact our office to request authorization.  Prescriptions will not be filled after 5pm or on week-ends. 4. You should eat very light the first 24 hours after surgery, such as soup, crackers, pudding, etc.  Resume your normal diet the day after surgery. 5. Most patients will experience some swelling and bruising in the breast.  Ice packs and a good support bra will help.  Swelling and bruising can take several days to resolve.  6. It is common to experience some constipation if taking pain medication after surgery.  Increasing fluid intake and taking a stool softener will usually help or prevent this problem from occurring.  A mild laxative (Milk of Magnesia or Miralax) should be taken according to package directions if there are no bowel movements after 48 hours. 7. Unless discharge instructions indicate otherwise, you may remove your bandages 48 hours after surgery, and you may shower at that time.  You may have steri-strips (small skin tapes) in place directly over the incision.  These strips should be left on the skin for 14.  If your surgeon used skin glue on the incision, you may shower in 24 hours.  The glue will flake off over the next 2-3  weeks.  Any sutures or staples will be removed at the office during your follow-up visit. 8. ACTIVITIES:  You may resume light daily activities (gradually increasing) beginning the next day. Light activities for one week then resume normal activities as long as you are pain-free.  Wearing a good support bra or sports bra minimizes pain and swelling.  You may have sexual intercourse when it is comfortable. a. You may drive when you no longer are taking prescription pain medication, you can comfortably wear a seatbelt, and you can safely maneuver your car and apply brakes. b. RETURN TO WORK:  ______________________________________________________________________________________ 9. You should see your doctor in the office for a follow-up appointment approximately two weeks after your surgery.    Expect your pathology report 3 business days after your surgery.  You may call to check if you do not hear from [place] after three days. 10. OTHER INSTRUCTIONS: _______________________________________________________________________________________________ _____________________________________________________________________________________________________________________________________ _____________________________________________________________________________________________________________________________________ _____________________________________________________________________________________________________________________________________  WHEN TO CALL YOUR DOCTOR: 1. Fever over 101.0 2. Nausea and/or vomiting. 3. Extreme swelling or bruising. 4. Continued bleeding from incision. 5. Increased pain, redness, or drainage from the incision.  The clinic staff is available to answer your questions during regular business hours.  Please dont hesitate to call and ask to speak to one of the nurses for clinical concerns.  If you have a medical emergency, go to the nearest emergency room or call 911.  A surgeon  from [facility] is always on call at the hospital.  For further questions, please visit [email or web address]  Post Anesthesia Home Care Instructions ° °Activity: °Get plenty of rest for the remainder of the day. A responsible adult should stay with you for 24 hours following the procedure.  °For the next 24 hours, DO NOT: °-Drive a car °-Operate machinery °-Drink alcoholic beverages °-Take any medication unless instructed by your physician °-Make any legal decisions or sign important papers. ° °Meals: °Start with liquid foods such as gelatin or soup. Progress to regular foods as tolerated. Avoid greasy, spicy, heavy foods. If nausea and/or vomiting occur, drink only clear liquids until the nausea and/or vomiting subsides. Call your physician if vomiting continues. ° °Special Instructions/Symptoms: °Your throat may feel dry or sore from the anesthesia or the breathing tube placed in your throat during surgery. If this causes discomfort, gargle with warm salt water. The discomfort should disappear within 24 hours. ° °If you had a scopolamine patch placed behind your ear for the management of post- operative nausea and/or vomiting: ° °1. The medication in the patch is effective for 72 hours, after which it should be removed.  Wrap patch in a tissue and discard in the trash. Wash hands thoroughly with soap and water. °2. You may remove the patch earlier than 72 hours if you experience unpleasant side effects which may include dry mouth, dizziness or visual disturbances. °3. Avoid touching the patch. Wash your hands with soap and water after contact with the patch. °  ° °

## 2015-06-29 NOTE — H&P (View-Only) (Signed)
Sabrina Short 06/12/2015 3:44 PM Location: [facility] Patient #: [id number] DOB: [birth year] Single / Language: Cleophus Molt / Race: Black or African American Female  History of Present Illness Odis Hollingshead MD; 06/12/2015 4:23 PM) The patient is a 25 year old female.   Note:She is referred by Cammie Sickle, FNP for consultation regarding bilateral breast masses. She has been aware of a left breast mass for approximately one year. This is in the 7 o'clock position. She states she feel feels this one is getting larger. She has a significant daily caffeine intake. Ultrasound was performed and the results are below. Her mother is here with her and had something similar to this when she was younger. She stopped her caffeine intake and did not have to have any further treatment.  She has sickle cell anemia. Her last hemoglobin was 12.  EXAM: ULTRASOUND OF THE BILATERAL BREAST  COMPARISON: None.  FINDINGS: Physical examination of the outer right breast at site of palpable concern reveals a firm highly mobile mass at the approximate 10 o'clock position. Physical examination of the upper-outer left breast reveals thickening at the approximate 1 o'clock position. Physical examination of the lower inner left breast reveals a very large firm highly mobile mass.  Targeted ultrasound of the right breast was performed demonstrating an oval well-circumscribed hypoechoic mass at 10 o'clock 4 cm from the nipple measuring 2.1 x 1.3 x 2 cm. This corresponds with the palpable abnormality in the upper-outer right breast. In addition there is a smaller similar-appearing oval circumscribed hypoechoic masses 634 cm from the nipple measuring 1.6 x 0.6 x 1.4 cm. 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  Targeted ultrasound of the upper-outer left breast demonstrates a cluster of cysts at 1 o'clock 4 cm from the nipple measuring 1 x 0.5 x 1.1 cm. There is a large  well-circumscribed oval hypoechoic mass in the left breast at 7 o'clock 4 cm from nipple measuring 5.1 x 2.5 x 5.1 cm. The patient states she has been feeling this mass for many years, however feels as if it is increasing in size. Note that this mass was incorrectly labeled and is actually at the 7 o'clock position in the left breast.  IMPRESSION: 1. Probably benign right breast masses.  2. Cluster of cysts in the upper-outer left breast.  3. Large left breast mass at the 7 o'clock position which demonstrates imaging features suggestive of a fibroadenoma, however the patient states this has been increasing in size and measures over 5 cm.  RECOMMENDATION: 1. Six-month follow-up right breast ultrasound for the probably benign masses in the right breast.  2. Recommend referral to a breast surgeon for the mass in the left breast in the 7 o'clock position that measures greater than 5 cm and is increasing in size per the patient. The patient states she would like to discuss this with her primary physician the 4 making an appointment to see a surgeon.  Other Problems Jeralyn Ruths, [place]; 06/12/2015 4:15 PM) Anxiety Disorder Cholelithiasis Sickle cell disease Transfusion history  Past Surgical History Jeralyn Ruths, [place]; 06/12/2015 4:15 PM) Gallbladder Surgery - Laparoscopic Oral Surgery  Diagnostic Studies History Jeralyn Ruths, [place]; 06/12/2015 4:15 PM) Colonoscopy never Mammogram never Pap Smear 1-5 years ago  Allergies Marjean Donna, CMA; 06/12/2015 3:45 PM) Morphine Sulfate (Concentrate) *ANALGESICS - OPIOID*  Medication History Davy Pique Bynum, CMA; 06/12/2015 3:45 PM) Tylenol Extra Strength (500MG  Tablet, Oral) Active. BuSpar (10MG  Tablet, Oral) Active. Vitamin D (Ergocalciferol) (50000UNIT Capsule, Oral) Active. Folic Acid (  1MG  Tablet, Oral) Active. Ibuprofen (600MG  Tablet, Oral) Active. Loratadine (10MG  Tablet, Oral) Active. Multiple Vitamin (1 (one)  Oral) Active. Medications Reconciled  Pregnancy / Birth History Jeralyn Ruths, [place]; 06/12/2015 4:15 PM) Age at menarche 39 years. Contraceptive History Intrauterine device. Gravida 0 Irregular periods Para 0     Review of Systems Jearld Fenton Morris CMA; 06/12/2015 4:15 PM) General Not Present- Appetite Loss, Chills, Fatigue, Fever, Night Sweats, Weight Gain and Weight Loss. Skin Not Present- Change in Wart/Mole, Dryness, Hives, Jaundice, New Lesions, Non-Healing Wounds, Rash and Ulcer. HEENT Present- Seasonal Allergies. Not Present- Earache, Hearing Loss, Hoarseness, Nose Bleed, Oral Ulcers, Ringing in the Ears, Sinus Pain, Sore Throat, Visual Disturbances, Wears glasses/contact lenses and Yellow Eyes. Breast Present- Breast Mass. Not Present- Breast Pain, Nipple Discharge and Skin Changes. Cardiovascular Not Present- Chest Pain, Difficulty Breathing Lying Down, Leg Cramps, Palpitations, Rapid Heart Rate, Shortness of Breath and Swelling of Extremities. Gastrointestinal Not Present- Abdominal Pain, Bloating, Bloody Stool, Change in Bowel Habits, Chronic diarrhea, Constipation, Difficulty Swallowing, Excessive gas, Gets full quickly at meals, Hemorrhoids, Indigestion, Nausea, Rectal Pain and Vomiting. Female Genitourinary Present- Frequency. Not Present- Nocturia, Painful Urination, Pelvic Pain and Urgency. Musculoskeletal Not Present- Back Pain, Joint Pain, Joint Stiffness, Muscle Pain, Muscle Weakness and Swelling of Extremities. Neurological Not Present- Decreased Memory, Fainting, Headaches, Numbness, Seizures, Tingling, Tremor, Trouble walking and Weakness. Psychiatric Not Present- Anxiety, Bipolar, Change in Sleep Pattern, Depression, Fearful and Frequent crying. Endocrine Not Present- Cold Intolerance, Excessive Hunger, Hair Changes, Heat Intolerance, Hot flashes and New Diabetes. Hematology Not Present- Easy Bruising, Excessive bleeding, Gland problems, HIV and Persistent  Infections.  Vitals (Sonya Bynum CMA; 06/12/2015 3:45 PM) 06/12/2015 3:44 PM Weight: 119 lb Height: 62in Body Surface Area: 1.53 m Body Mass Index: 21.77 kg/m  Temp.: 62F(Temporal)  Pulse: 78 (Regular)  BP: 122/70 (Sitting, Left Arm, Standard)      Physical Exam Odis Hollingshead MD; 06/12/2015 4:25 PM)  The physical exam findings are as follows: Note:General: WDWN in NAD. Pleasant and cooperative.  EYES: EOMI, no icterus  CV: RRR, no murmur, no JVD.  CHEST: Breath sounds equal and clear. Respirations nonlabored.  BREASTS: Symmetrical in size. There are 4 palpable masses in the left breast. There is a 5 cm mobile mass at the 7 o'clock position. This mass is tenting up the skin. There is a 2.5 cm mobile mass at the 10 o'clock position abutting the sternum. There is a 2.5 cm mass 1 o'clock position that is mobile in the axillary area. There is a 2.5 cm mass 4 o'clock position that is mobile.  There are 2 palpable masses in the right breast. There is a 2.5 cm mass that is mobile to 10 o'clock position. There is a 1 cm mass palpable at the 6 o'clock position. There are multiple other irregularities present.  ABDOMEN: Soft, nontender, well-healed small upper abdominal scars.  NEUROLOGIC: Alert and oriented, answers questions appropriately, normal gait and station.  PSYCHIATRIC: Normal mood, affect , and behavior.    Assessment & Plan Odis Hollingshead MD; 06/12/2015 4:29 PM)  MASSES OF BOTH BREASTS (N63) Impression: She has 4 masses in the left breast. She has 2 masses in the right breast. The largest mass in the left breast is 5 cm and has been growing. It is at the 7 o'clock position. Her hemoglobin was 12.2 approximately 5 weeks ago.  Plan: Avoid caffeine. Removal of 7:00 left breast mass. We will then monitor the other masses and if they get  larger, we will schedule them for removal as well. I have explained the procedure, risks, and aftercare to her. Risks  include but are not limited to bleeding, infection, wound problems, cosmetic deformity, anesthesia. She seems to [facility] and agrees with the plan.  Jackolyn Confer, MD

## 2015-07-03 ENCOUNTER — Encounter (HOSPITAL_BASED_OUTPATIENT_CLINIC_OR_DEPARTMENT_OTHER): Payer: Self-pay | Admitting: General Surgery

## 2015-07-03 ENCOUNTER — Telehealth: Payer: Self-pay

## 2015-07-03 DIAGNOSIS — D5700 Hb-SS disease with crisis, unspecified: Secondary | ICD-10-CM

## 2015-07-03 MED ORDER — OXYCODONE HCL 5 MG PO TABS: 5.0000 mg | ORAL_TABLET | ORAL | Status: DC | PRN

## 2015-07-03 NOTE — Telephone Encounter (Signed)
Reviewed [place] Substance Reporting system prior to prescribing opiate medications, no inconsistencies noted.   Meds ordered this encounter  Medications  . oxyCODONE (OXY IR/ROXICODONE) 5 MG immediate release tablet    Sig: Take 1 tablet (5 mg total) by mouth every 4 (four) hours as needed for severe pain.    Dispense:  60 tablet    Refill:  0    Order Specific Question:  Supervising Provider    Answer:  Tresa Garter [id number]    Dorena Dew, FNP

## 2015-07-03 NOTE — Telephone Encounter (Signed)
Pt called to request a medication refill for Oxycodone. Thanks!

## 2015-07-19 ENCOUNTER — Telehealth: Payer: Self-pay

## 2015-07-19 NOTE — Telephone Encounter (Signed)
Refill request for oxycodone 5mg . LOV 05/30/2015. Please advise. Thanks!

## 2015-07-19 NOTE — Telephone Encounter (Signed)
Pt is requesting medication refill for Oxycodone, 5mg . Thanks!

## 2015-07-21 ENCOUNTER — Other Ambulatory Visit: Payer: Self-pay | Admitting: Family Medicine

## 2015-07-21 DIAGNOSIS — D5700 Hb-SS disease with crisis, unspecified: Secondary | ICD-10-CM

## 2015-07-21 MED ORDER — OXYCODONE HCL 5 MG PO TABS: 5.0000 mg | ORAL_TABLET | ORAL | Status: DC | PRN

## 2015-08-02 ENCOUNTER — Telehealth: Payer: Self-pay

## 2015-08-02 DIAGNOSIS — D5700 Hb-SS disease with crisis, unspecified: Secondary | ICD-10-CM

## 2015-08-02 NOTE — Telephone Encounter (Signed)
Pt is requesting a refill on her Oxycodone, 5mg . Thanks!

## 2015-08-02 NOTE — Telephone Encounter (Signed)
Patient requesting a refill for oxycodone. LOV /05/30/2015. Please advise. Thanks!

## 2015-08-04 MED ORDER — OXYCODONE HCL 5 MG PO TABS: 5.0000 mg | ORAL_TABLET | ORAL | Status: DC | PRN

## 2015-08-04 NOTE — Telephone Encounter (Signed)
Reviewed [place] Substance Reporting system prior to prescribing opiate medications, no inconsistencies noted.   Meds ordered this encounter  Medications  . oxyCODONE (OXY IR/ROXICODONE) 5 MG immediate release tablet    Sig: Take 1 tablet (5 mg total) by mouth every 4 (four) hours as needed for severe pain.    Dispense:  60 tablet    Refill:  0    Order Specific Question:  Supervising Provider    Answer:  Tresa Garter [id number]     Dorena Dew, FNP

## 2015-08-16 ENCOUNTER — Telehealth: Payer: Self-pay

## 2015-08-16 DIAGNOSIS — D5700 Hb-SS disease with crisis, unspecified: Secondary | ICD-10-CM

## 2015-08-16 NOTE — Telephone Encounter (Signed)
Pt is requesting a medication refill for her Oxycodone. Thanks!

## 2015-08-17 MED ORDER — OXYCODONE HCL 5 MG PO TABS: 5.0000 mg | ORAL_TABLET | ORAL | Status: DC | PRN

## 2015-08-17 NOTE — Telephone Encounter (Signed)
Reviewed [place] Substance Reporting system prior to prescribing opiate medications.  Meds ordered this encounter  Medications  . oxyCODONE (OXY IR/ROXICODONE) 5 MG immediate release tablet    Sig: Take 1 tablet (5 mg total) by mouth every 4 (four) hours as needed for severe pain.    Dispense:  60 tablet    Refill:  0    Order Specific Question:  Supervising Provider    Answer:  Tresa Garter [id number]    Dorena Dew, FNP

## 2015-08-17 NOTE — Telephone Encounter (Signed)
Refill request for oxycodone. LOV. 05/30/2015. Please advise. Thanks!

## 2015-08-18 ENCOUNTER — Ambulatory Visit (INDEPENDENT_AMBULATORY_CARE_PROVIDER_SITE_OTHER): Payer: Managed Care, Other (non HMO) | Admitting: Family Medicine

## 2015-08-18 ENCOUNTER — Other Ambulatory Visit: Payer: Self-pay | Admitting: Family Medicine

## 2015-08-18 ENCOUNTER — Encounter: Payer: Self-pay | Admitting: Family Medicine

## 2015-08-18 VITALS — BP 126/88 | HR 88 | Temp 98.1°F | Resp 14 | Ht 62.0 in | Wt 117.0 lb

## 2015-08-18 DIAGNOSIS — D571 Sickle-cell disease without crisis: Secondary | ICD-10-CM | POA: Diagnosis not present

## 2015-08-18 DIAGNOSIS — D5700 Hb-SS disease with crisis, unspecified: Secondary | ICD-10-CM | POA: Diagnosis not present

## 2015-08-18 DIAGNOSIS — Z79899 Other long term (current) drug therapy: Secondary | ICD-10-CM

## 2015-08-18 DIAGNOSIS — Z79891 Long term (current) use of opiate analgesic: Secondary | ICD-10-CM

## 2015-08-18 DIAGNOSIS — F909 Attention-deficit hyperactivity disorder, unspecified type: Secondary | ICD-10-CM | POA: Diagnosis not present

## 2015-08-18 LAB — COMPLETE METABOLIC PANEL WITH GFR
ALT: 6 U/L (ref 6–29)
AST: 12 U/L (ref 10–30)
Albumin: 4.4 g/dL (ref 3.6–5.1)
Alkaline Phosphatase: 51 U/L (ref 33–115)
BILIRUBIN TOTAL: 0.9 mg/dL (ref 0.2–1.2)
BUN: 7 mg/dL (ref 7–25)
CHLORIDE: 107 mmol/L (ref 98–110)
CO2: 24 mmol/L (ref 20–31)
CREATININE: 0.6 mg/dL (ref 0.50–1.10)
Calcium: 9.4 mg/dL (ref 8.6–10.2)
GFR, Est African American: >89 mL/min (ref >=60)
GLUCOSE: 96 mg/dL (ref 65–99)
Potassium: 4.2 mmol/L (ref 3.5–5.3)
SODIUM: 141 mmol/L (ref 135–146)
TOTAL PROTEIN: 7.5 g/dL (ref 6.1–8.1)

## 2015-08-18 LAB — CBC WITH DIFFERENTIAL/PLATELET
BASOS ABS: 0 {cells}/uL (ref 0–200)
BASOS PCT: 0 %
EOS ABS: 89 {cells}/uL (ref 15–500)
Eosinophils Relative: 1 %
HEMATOCRIT: 34.9 % — AB (ref 35.0–45.0)
Hemoglobin: 11.5 g/dL — ABNORMAL LOW (ref 11.7–15.5)
Lymphocytes Relative: 29 %
Lymphs Abs: 2581 cells/uL (ref 850–3900)
MCH: 26 pg — AB (ref 27.0–33.0)
MCHC: 33 g/dL (ref 32.0–36.0)
MCV: 78.8 fL — AB (ref 80.0–100.0)
MONO ABS: 623 {cells}/uL (ref 200–950)
MONOS PCT: 7 %
MPV: 9.1 fL (ref 7.5–12.5)
NEUTROS ABS: 5607 {cells}/uL (ref 1500–7800)
Neutrophils Relative %: 63 %
PLATELETS: 539 10*3/uL — AB (ref 140–400)
RBC: 4.43 MIL/uL (ref 3.80–5.10)
RDW: 19 % — ABNORMAL HIGH (ref 11.0–15.0)
WBC: 8.9 10*3/uL (ref 3.8–10.8)

## 2015-08-18 LAB — POCT URINALYSIS DIP (DEVICE)
BILIRUBIN URINE: NEGATIVE
GLUCOSE, UA: NEGATIVE mg/dL
Hgb urine dipstick: NEGATIVE
KETONES UR: NEGATIVE mg/dL
Nitrite: NEGATIVE
PROTEIN: NEGATIVE mg/dL
Specific Gravity, Urine: 1.02 (ref 1.005–1.030)
Urobilinogen, UA: 2 mg/dL — ABNORMAL HIGH (ref 0.0–1.0)
pH: 6 (ref 5.0–8.0)

## 2015-08-18 LAB — RETICULOCYTES
ABS RETIC: 217070 {cells}/uL — AB (ref 20000–80000)
RBC.: 4.43 MIL/uL (ref 3.80–5.10)
Retic Ct Pct: 4.9 %

## 2015-08-18 MED ORDER — OXYCODONE HCL 10 MG PO TABS: 10.0000 mg | ORAL_TABLET | ORAL | Status: DC | PRN

## 2015-08-18 NOTE — Progress Notes (Signed)
Subjective:    Patient ID: Sabrina Short, female    DOB: 19-Jun-1990, 25 y.o.   MRN: [id number]  HPI Sabrina Short, a student with a history of sickle cell anemia, HbSC presents for 1 month follow up of sickle cell anemia. Patient reports that she feels well and has minimal complaints.  She reports mild pain to left shoulder. She says that shoulder pain occurs primarily at night. Current pain intensithy is 3/10 described as intermittent and aching.  She says that she has been taking Oxycodone 5 mg more frequently due to increased left shoulder pain. She denies fever, fatigue, headache, shortness of breath, dysuria, nausea, vomiting, or diarrhea. Past Medical History  Diagnosis Date  . Sickle cell disease ([place])   . 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 History of blood transfusion   . Mass of breast, left 06/2015  . 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  Allergies  Allergen Reactions  . Morphine And Related Other (See Comments)    CAUSES SICKLE CELL FLARE   Social History   Social History  . Marital Status: Single    Spouse Name: N/A  . Number of Children: N/A  . Years of Education: N/A   Occupational History  . Not on file.   Social History Main Topics  . Smoking status: Former Smoker    Quit date: 07/16/2014  . Smokeless tobacco: Never Used  . Alcohol Use: Yes     Comment: occasionally  . Drug Use: No  . Sexual Activity: Yes    Birth Control/ Protection: IUD   Other Topics Concern  . Not on file   Social History Narrative   There is no immunization history on file for this patient. Review of Systems  Constitutional: Positive for fatigue.  HENT: Negative.   Eyes: Negative.   Respiratory: Negative.   Cardiovascular: Negative.   Gastrointestinal: Negative.   Endocrine: Negative for polydipsia, polyphagia and polyuria.  Genitourinary: Negative.   Musculoskeletal: Positive  for myalgias (Left shoulder pain).  Skin: Negative.   Allergic/Immunologic: Negative for environmental allergies and food allergies.  Neurological: Negative.  Negative for dizziness and headaches.  Hematological: Negative.   Psychiatric/Behavioral: Negative.        Objective:   Physical Exam  Constitutional: She is oriented to person, place, and time. She appears well-developed and well-nourished.  HENT:  Head: Normocephalic and atraumatic.  Right Ear: External ear normal.  Left Ear: External ear normal.  Mouth/Throat: Oropharynx is clear and moist.  Eyes: Conjunctivae and EOM are normal. Pupils are equal, round, and reactive to light.  Cardiovascular: Normal rate, regular rhythm, normal heart sounds and intact distal pulses.   Pulmonary/Chest: Effort normal and breath sounds normal.  Abdominal: Soft. Bowel sounds are normal.  Musculoskeletal: Normal range of motion.  Neurological: She is alert and oriented to person, place, and time. She has normal reflexes.  Skin: Skin is warm and dry.  Psychiatric: She has a normal mood and affect. Her behavior is normal. Judgment and thought content normal.      BP 126/88 mmHg  Pulse 88  Temp(Src) 98.1 F (36.7 C) (Oral)  Resp 14  Ht 5\' 2"  (1.575 m)  Wt 117 lb (53.071 kg)  BMI 21.39 kg/m2  SpO2 100% Assessment & Plan:    Hb-SS disease without crisis (HCC)  Continue folic acid 1 mg  daily to prevent aplastic bone marrow crises.   Pulmonary evaluation - Patient denies severe recurrent wheezes, shortness of breath with exercise, or persistent cough. If these symptoms develop, pulmonary function tests with spirometry will be ordered, and if abnormal, plan on referral to Pulmonology for further evaluation.  Cardiac - Routine screening for pulmonary hypertension is not recommended.  Eye - High risk of proliferative retinopathy. Annual eye exam with retinal exam recommended to patient. She is up to date with eye examination  Immunization  status - She will bring her immunization record for review   Acute and chronic painful episodes - Patient uses opiate medication minimally for occasional pain related to sickle cell anemia.  We discussed that pt is to receive her Schedule II prescriptions only from [place]. Pt is also aware that the prescription history is available to [place] online through the [facility] CSRS. Controlled substance agreement signed previously. We reminded Sabrina Short that all patients receiving Schedule II narcotics must be seen for follow within one month of prescription being requested. We reviewed the terms of our pain agreement, including the need to keep medicines in a safe locked location away from children or pets, and the need to report excess sedation or constipation, measures to avoid constipation, and policies related to early refills and stolen prescriptions. According to the [place] [facility] program, we have reviewed details related to analgesia, adverse effects, aberrant behaviors. Reviewed [place] Substance Reporting system prior to prescribing opiate medications  - Urinalysis Dipstick - Prescription Monitoring Profile (17)-[facility] - COMPLETE METABOLIC PANEL WITH GFR - CBC with Differential - Reticulocytes - oxyCODONE 10 MG TABS; Take 1 tablet (10 mg total) by mouth every 4 (four) hours as needed for severe pain.  Dispense: 60 tablet; Refill: 0 - POCT urinalysis dip (device)  2. Attention deficit hyperactivity disorder (ADHD), unspecified ADHD type Patient to follow up with Dr. Meryle Ready therapist as scheduled for ADHD  - dexmethylphenidate (FOCALIN) 5 MG tablet; Take 5 mg by mouth 1 day or 1 dose.  RTC: Follow up in 1 month for sickle cell anemia   The patient was given clear instructions to go to ER or return to medical center if symptoms do not improve, worsen or new problems develop. The patient verbalized understanding.      Dorena Dew, FNP

## 2015-08-18 NOTE — Addendum Note (Signed)
Addended by: Adelina Mings on: 08/18/2015 01:52 PM   Modules accepted: Orders

## 2015-08-27 LAB — PAIN MGMT, PROF 5 DL CONF W/O MM, U
AMPHETAMINES: NEGATIVE ng/mL (ref <500)
BARBITURATES: NEGATIVE ng/mL (ref <300)
Benzodiazepines: NEGATIVE ng/mL (ref <100)
COCAINE METABOLITE: NEGATIVE ng/mL (ref <150)
Codeine: NEGATIVE ng/mL (ref <50)
Creatinine: 110.6 mg/dL (ref >=20.0)
HYDROCODONE: NEGATIVE ng/mL (ref <50)
HYDROMORPHONE: NEGATIVE ng/mL (ref <50)
MARIJUANA METABOLITE: NEGATIVE ng/mL (ref <20)
Methadone Metabolite: NEGATIVE ng/mL (ref <100)
Morphine: NEGATIVE ng/mL (ref <50)
Norhydrocodone: NEGATIVE ng/mL (ref <50)
OXYCODONE: NEGATIVE ng/mL (ref <100)
Opiates: NEGATIVE ng/mL (ref <100)
Oxidant: NEGATIVE ug/mL (ref <200)
pH: 6.83 (ref 4.5–9.0)

## 2015-08-30 ENCOUNTER — Ambulatory Visit: Payer: Managed Care, Other (non HMO) | Admitting: Family Medicine

## 2015-09-01 ENCOUNTER — Other Ambulatory Visit: Payer: Self-pay | Admitting: Family Medicine

## 2015-09-05 ENCOUNTER — Encounter: Payer: Self-pay | Admitting: Family Medicine

## 2015-09-05 ENCOUNTER — Ambulatory Visit (INDEPENDENT_AMBULATORY_CARE_PROVIDER_SITE_OTHER): Payer: Managed Care, Other (non HMO) | Admitting: Family Medicine

## 2015-09-05 VITALS — BP 118/78 | HR 93 | Temp 98.2°F | Resp 18 | Ht 62.0 in | Wt 116.0 lb

## 2015-09-05 DIAGNOSIS — D571 Sickle-cell disease without crisis: Secondary | ICD-10-CM

## 2015-09-05 MED ORDER — OXYCODONE HCL 10 MG PO TABS: 10.0000 mg | ORAL_TABLET | ORAL | Status: AC | PRN

## 2015-09-05 NOTE — Patient Instructions (Signed)
Chronic Pain °Chronic pain can be defined as pain that is off and on and lasts for 3-6 months or longer. Many things cause chronic pain, which can make it difficult to make a diagnosis. There are many treatment options available for chronic pain. However, finding a treatment that works well for you may require trying various approaches until the right one is found. Many people benefit from a combination of two or more types of treatment to control their pain. °SYMPTOMS  °Chronic pain can occur anywhere in the body and can range from mild to very severe. Some types of chronic pain include: °· Headache. °· Low back pain. °· Cancer pain. °· Arthritis pain. °· Neurogenic pain. This is pain resulting from damage to nerves. ° People with chronic pain may also have other symptoms such as: °· Depression. °· Anger. °· Insomnia. °· Anxiety. °DIAGNOSIS  °Your health care provider will help diagnose your condition over time. In many cases, the initial focus will be on excluding possible conditions that could be causing the pain. Depending on your symptoms, your health care provider may order tests to diagnose your condition. Some of these tests may include:  °· Blood tests.   °· CT scan.   °· MRI.   °· X-rays.   °· Ultrasounds.   °· Nerve conduction studies.   °You may need to see a specialist.  °TREATMENT  °Finding treatment that works well may take time. You may be referred to a pain specialist. He or she may prescribe medicine or therapies, such as:  °· Mindful meditation or yoga. °· Shots (injections) of numbing or pain-relieving medicines into the spine or area of pain. °· Local electrical stimulation. °· Acupuncture.   °· Massage therapy.   °· Aroma, color, light, or sound therapy.   °· Biofeedback.   °· Working with a physical therapist to keep from getting stiff.   °· Regular, gentle exercise.   °· Cognitive or behavioral therapy.   °· Group support.   °Sometimes, surgery may be recommended.  °HOME CARE INSTRUCTIONS   °· Take all medicines as directed by your health care provider.   °· Lessen stress in your life by relaxing and doing things such as listening to calming music.   °· Exercise or be active as directed by your health care provider.   °· Eat a healthy diet and include things such as vegetables, fruits, fish, and lean meats in your diet.   °· Keep all follow-up appointments with your health care provider.   °· Attend a support group with others suffering from chronic pain. °SEEK MEDICAL CARE IF:  °· Your pain gets worse.   °· You develop a new pain that was not there before.   °· You cannot tolerate medicines given to you by your health care provider.   °· You have new symptoms since your last visit with your health care provider.   °SEEK IMMEDIATE MEDICAL CARE IF:  °· You feel weak.   °· You have decreased sensation or numbness.   °· You lose control of bowel or bladder function.   °· Your pain suddenly gets much worse.   °· You develop shaking. °· You develop chills. °· You develop confusion. °· You develop chest pain. °· You develop shortness of breath.   °MAKE SURE YOU: °· Understand these instructions. °· Will watch your condition. °· Will get help right away if you are not doing well or get worse. °  °This information is not intended to replace advice given to you by your health care provider. Make sure you discuss any questions you have with your health care provider. °  °Document Released: 11/24/2001   Document Revised: 11/04/2012 Document Reviewed: 08/28/2012 °Elsevier Interactive Patient Education ©2016 [employer] °Sickle Cell Anemia, Adult °Sickle cell anemia is a condition in which red blood cells have an abnormal "sickle" shape. This abnormal shape shortens the cells' life span, which results in a lower than normal concentration of red blood cells in the blood. The sickle shape also causes the cells to clump together and block free blood flow through the blood vessels. As a result, the tissues and organs  of the body do not receive enough oxygen. Sickle cell anemia causes organ damage and pain and increases the risk of infection. °CAUSES  °Sickle cell anemia is a genetic disorder. Those who receive two copies of the gene have the condition, and those who receive one copy have the trait. °RISK FACTORS °The sickle cell gene is most common in people whose families originated in [place]. Other areas of the globe where sickle cell trait occurs include the [place], [place] and [place], the [place], and the [place].  °SIGNS AND SYMPTOMS °· Pain, especially in the extremities, back, chest, or abdomen (common). The pain may start suddenly or may develop following an illness, especially if there is dehydration. Pain can also occur due to overexertion or exposure to extreme temperature changes. °· Frequent severe bacterial infections, especially certain types of pneumonia and meningitis. °· Pain and swelling in the hands and feet. °· Decreased activity.   °· Loss of appetite.   °· Change in behavior. °· Headaches. °· Seizures. °· Shortness of breath or difficulty breathing. °· Vision changes. °· Skin ulcers. °Those with the trait may not have symptoms or they may have mild symptoms.  °DIAGNOSIS  °Sickle cell anemia is diagnosed with blood tests that demonstrate the genetic trait. It is often diagnosed during the newborn period, due to mandatory testing nationwide. A variety of blood tests, X-rays, CT scans, MRI scans, ultrasounds, and lung function tests may also be done to monitor the condition. °TREATMENT  °Sickle cell anemia may be treated with: °· Medicines. You may be given pain medicines, antibiotic medicines (to treat and prevent infections) or medicines to increase the production of certain types of hemoglobin. °· Fluids. °· Oxygen. °· Blood transfusions. °HOME CARE INSTRUCTIONS  °· Drink enough fluid to keep your urine clear or pale yellow. Increase your fluid intake in hot weather and during  exercise. °· Do not smoke. Smoking lowers oxygen levels in the blood.   °· Only take over-the-counter or prescription medicines for pain, fever, or discomfort as directed by your health care provider. °· Take antibiotics as directed by your health care provider. Make sure you finish them it even if you start to feel better.   °· Take supplements as directed by your health care provider.   °· Consider wearing a medical alert bracelet. This tells anyone caring for you in an emergency of your condition.   °· When traveling, keep your medical information, health care provider's names, and the medicines you take with you at all times.   °· If you develop a fever, do not take medicines to reduce the fever right away. This could cover up a problem that is developing. Notify your health care provider. °· Keep all follow-up appointments with your health care provider. Sickle cell anemia requires regular medical care. °SEEK MEDICAL CARE IF: ° You have a fever. °SEEK IMMEDIATE MEDICAL CARE IF:  °· You feel dizzy or faint.   °· You have new abdominal pain, especially on the left side near the stomach area.   °· You develop a persistent,   often uncomfortable and painful penile erection (priapism). If this is not treated immediately it will lead to impotence.   °· You have numbness your arms or legs or you have a hard time moving them.   °· You have a hard time with speech.   °· You have a fever or persistent symptoms for more than 2-3 days.   °· You have a fever and your symptoms suddenly get worse.   °· You have signs or symptoms of infection. These include:   °¨ Chills.   °¨ Abnormal tiredness (lethargy).   °¨ Irritability.   °¨ Poor eating.   °¨ Vomiting.   °· You develop pain that is not helped with medicine.   °· You develop shortness of breath. °· You have pain in your chest.   °· You are coughing up pus-like or bloody sputum.   °· You develop a stiff neck. °· Your feet or hands swell or have pain. °· Your abdomen appears  bloated. °· You develop joint pain. °MAKE SURE YOU: °· Understand these instructions. °  °This information is not intended to replace advice given to you by your health care provider. Make sure you discuss any questions you have with your health care provider. °  °Document Released: 06/12/2005 Document Revised: 03/25/2014 Document Reviewed: 10/14/2012 °Elsevier Interactive Patient Education ©2016 [employer] ° °

## 2015-09-05 NOTE — Progress Notes (Signed)
Subjective:    Patient ID: Sabrina Short, female    DOB: November 02, 1990, 25 y.o.   MRN: [id number]  HPI Sabrina Short, a student with a history of sickle cell anemia, HbSC presents for a post hospital follow-up. Sabrina Short was hospitalized at [facility] on 08/29/2015 with a sickle cell crisis. She says that pain was primarily to left shoulder and right leg.  She reports moderate pain to left shoulder. She says that shoulder pain occurs primarily at night. Current pain intensithy is 5/10 described as intermittent and sharp. She says that she last had Oxycodone 10 mg this am with moderate relief. She denies fever, fatigue, headache, shortness of breath, dysuria, nausea, vomiting, or diarrhea. Past Medical History  Diagnosis Date  . Sickle cell disease ([place])   . 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 History of blood transfusion   . Mass of breast, left 06/2015  . 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  Allergies  Allergen Reactions  . Morphine And Related Other (See Comments)    CAUSES SICKLE CELL FLARE   Social History   Social History  . Marital Status: Single    Spouse Name: N/A  . Number of Children: N/A  . Years of Education: N/A   Occupational History  . Not on file.   Social History Main Topics  . Smoking status: Former Smoker    Quit date: 07/16/2014  . Smokeless tobacco: Never Used  . Alcohol Use: Yes     Comment: occasionally  . Drug Use: No  . Sexual Activity: Yes    Birth Control/ Protection: IUD   Other Topics Concern  . Not on file   Social History Narrative   There is no immunization history on file for this patient. Review of Systems  Constitutional: Positive for fatigue.  HENT: Negative.   Eyes: Negative.   Respiratory: Negative.   Cardiovascular: Negative.   Gastrointestinal: Negative.   Endocrine: Negative for polydipsia, polyphagia and polyuria.  Genitourinary:  Negative.   Musculoskeletal: Positive for myalgias (Left shoulder pain).  Skin: Negative.   Allergic/Immunologic: Negative for environmental allergies and food allergies.  Neurological: Negative.  Negative for dizziness and headaches.  Hematological: Negative.   Psychiatric/Behavioral: Negative.        Objective:   Physical Exam  Constitutional: She is oriented to person, place, and time. She appears well-developed and well-nourished.  HENT:  Head: Normocephalic and atraumatic.  Right Ear: External ear normal.  Left Ear: External ear normal.  Mouth/Throat: Oropharynx is clear and moist.  Eyes: Conjunctivae and EOM are normal. Pupils are equal, round, and reactive to light.  Cardiovascular: Normal rate, regular rhythm, normal heart sounds and intact distal pulses.   Pulmonary/Chest: Effort normal and breath sounds normal.  Abdominal: Soft. Bowel sounds are normal.  Musculoskeletal:       Left shoulder: She exhibits decreased range of motion, tenderness and pain. She exhibits no swelling.  Neurological: She is alert and oriented to person, place, and time. She has normal reflexes.  Skin: Skin is warm and dry.  Psychiatric: She has a normal mood and affect. Her behavior is normal. Judgment and thought content normal.      BP 118/78 mmHg  Pulse 93  Temp(Src) 98.2 F (36.8 C) (Oral)  Resp 18  Ht 5\' 2"  (1.575 m)  Wt 116 lb (52.617 kg)  BMI 21.21 kg/m2  SpO2 95% Assessment & Plan:    Hb-SS disease without crisis ([place])  Continue folic acid 1 mg daily to prevent aplastic bone marrow crises.   Pulmonary evaluation - Patient denies severe recurrent wheezes, shortness of breath with exercise, or persistent cough. If these symptoms develop, pulmonary function tests with spirometry will be ordered, and if abnormal, plan on referral to Pulmonology for further evaluation.  Cardiac - Routine screening for pulmonary hypertension is not recommended.  Eye - High risk of proliferative  retinopathy. Annual eye exam with retinal exam recommended to patient. She is up to date with eye examination  Immunization status - She will bring her immunization record for review   Acute and chronic painful episodes - Patient uses opiate medication minimally for occasional pain related to sickle cell anemia.  We discussed that pt is to receive her Schedule II prescriptions only from [place]. Pt is also aware that the prescription history is available to [place] online through the [facility] CSRS. Controlled substance agreement signed previously. We reminded Sabrina Short that all patients receiving Schedule II narcotics must be seen for follow within one month of prescription being requested. We reviewed the terms of our pain agreement, including the need to keep medicines in a safe locked location away from children or pets, and the need to report excess sedation or constipation, measures to avoid constipation, and policies related to early refills and stolen prescriptions. According to the [place] [facility] program, we have reviewed details related to analgesia, adverse effects, aberrant behaviors. Reviewed [place] Substance Reporting system prior to prescribing opiate medications  - Oxycodone HCl 10 MG TABS; Take 1 tablet (10 mg total) by mouth every 4 (four) hours as needed.  Dispense: 60 tablet; Refill: 0     The patient was given clear instructions to go to ER or return to medical center if symptoms do not improve, worsen or new problems develop. The patient verbalized understanding.   Dorena Dew, Sabrina Short

## 2015-09-11 ENCOUNTER — Telehealth (HOSPITAL_COMMUNITY): Payer: Self-pay | Admitting: Internal Medicine

## 2015-09-11 NOTE — Telephone Encounter (Signed)
Pt called and states experiencing pain in shoulder and right leg; states been experiencing pain since yesterday; pt denies having chest pain, shortness of breath, abdominal pain; states experiencing some nausea and vomiting; informed pt that I will notify the provider and give her a return call with further instructions; pt verbalizes understanding

## 2015-09-11 NOTE — Telephone Encounter (Signed)
Notified patient that, per NP, she should go to the emergency department for evaluation because pt stated that had a syncopal episode on 09/10/15; pt verbalizes understanding

## 2015-09-26 ENCOUNTER — Ambulatory Visit: Payer: Managed Care, Other (non HMO) | Admitting: Family Medicine

## 2015-10-17 ENCOUNTER — Ambulatory Visit: Payer: Managed Care, Other (non HMO) | Admitting: Family Medicine

## 2015-12-04 HISTORY — PX: JOINT REPLACEMENT: SHX530

## 2015-12-28 ENCOUNTER — Encounter: Payer: Self-pay | Admitting: Physician Assistant

## 2015-12-28 DIAGNOSIS — M8700 Idiopathic aseptic necrosis of unspecified bone: Secondary | ICD-10-CM

## 2016-02-13 IMAGING — DX DG SHOULDER 2+V*L*
3 series · 3 of 3 positions shown · non-contrast
Comparison: None.

CLINICAL DATA: Chronic left shoulder pain for 3 months. Initial
encounter.

EXAM:
LEFT SHOULDER - 2+ VIEW

[shoulder grashey]
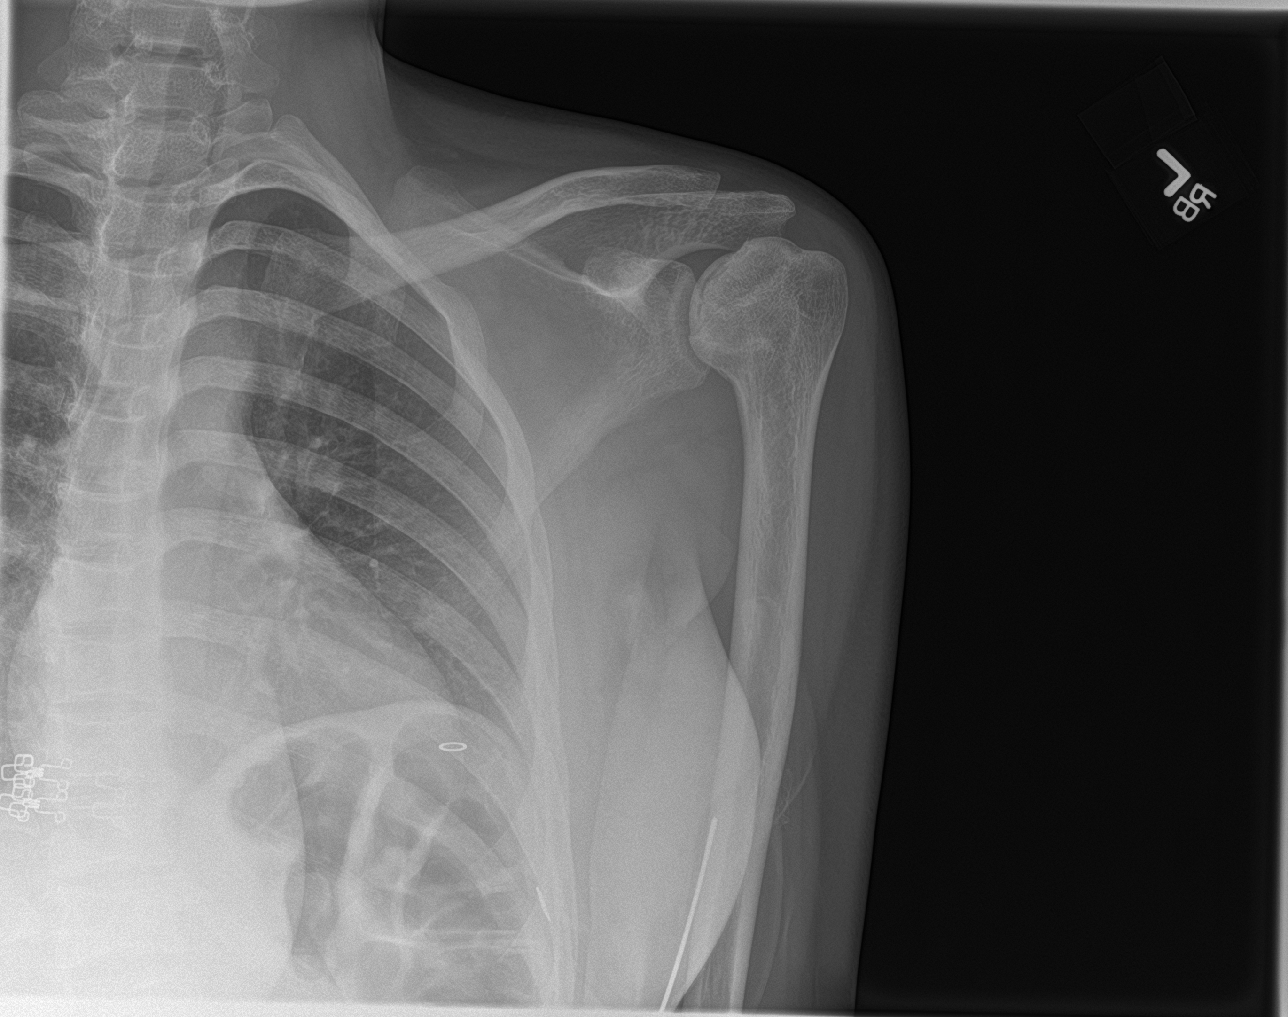

[shoulder y view]
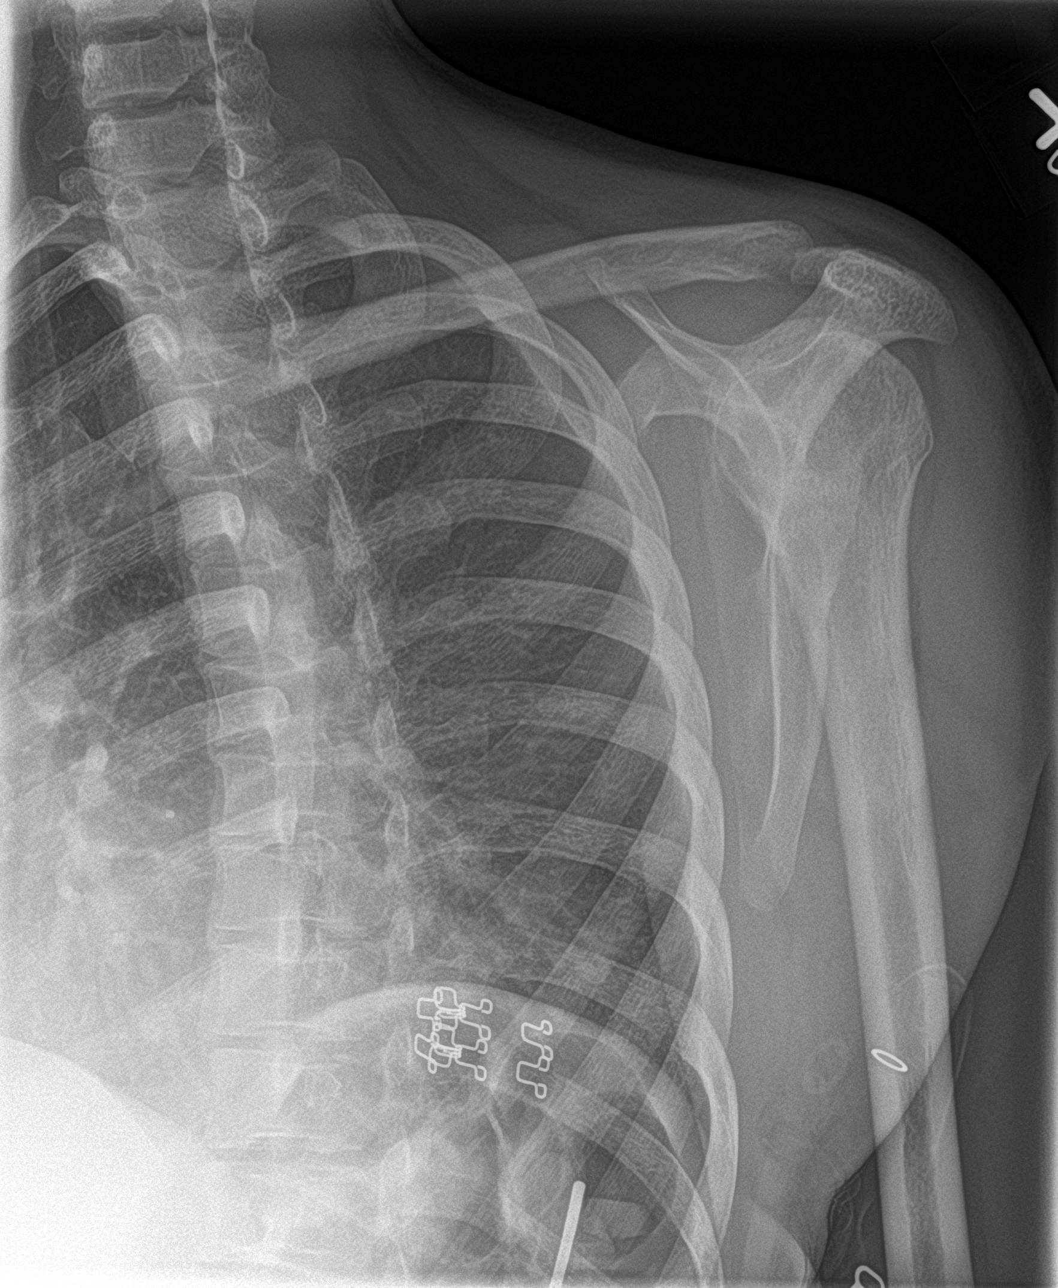

[shoulder axillary]
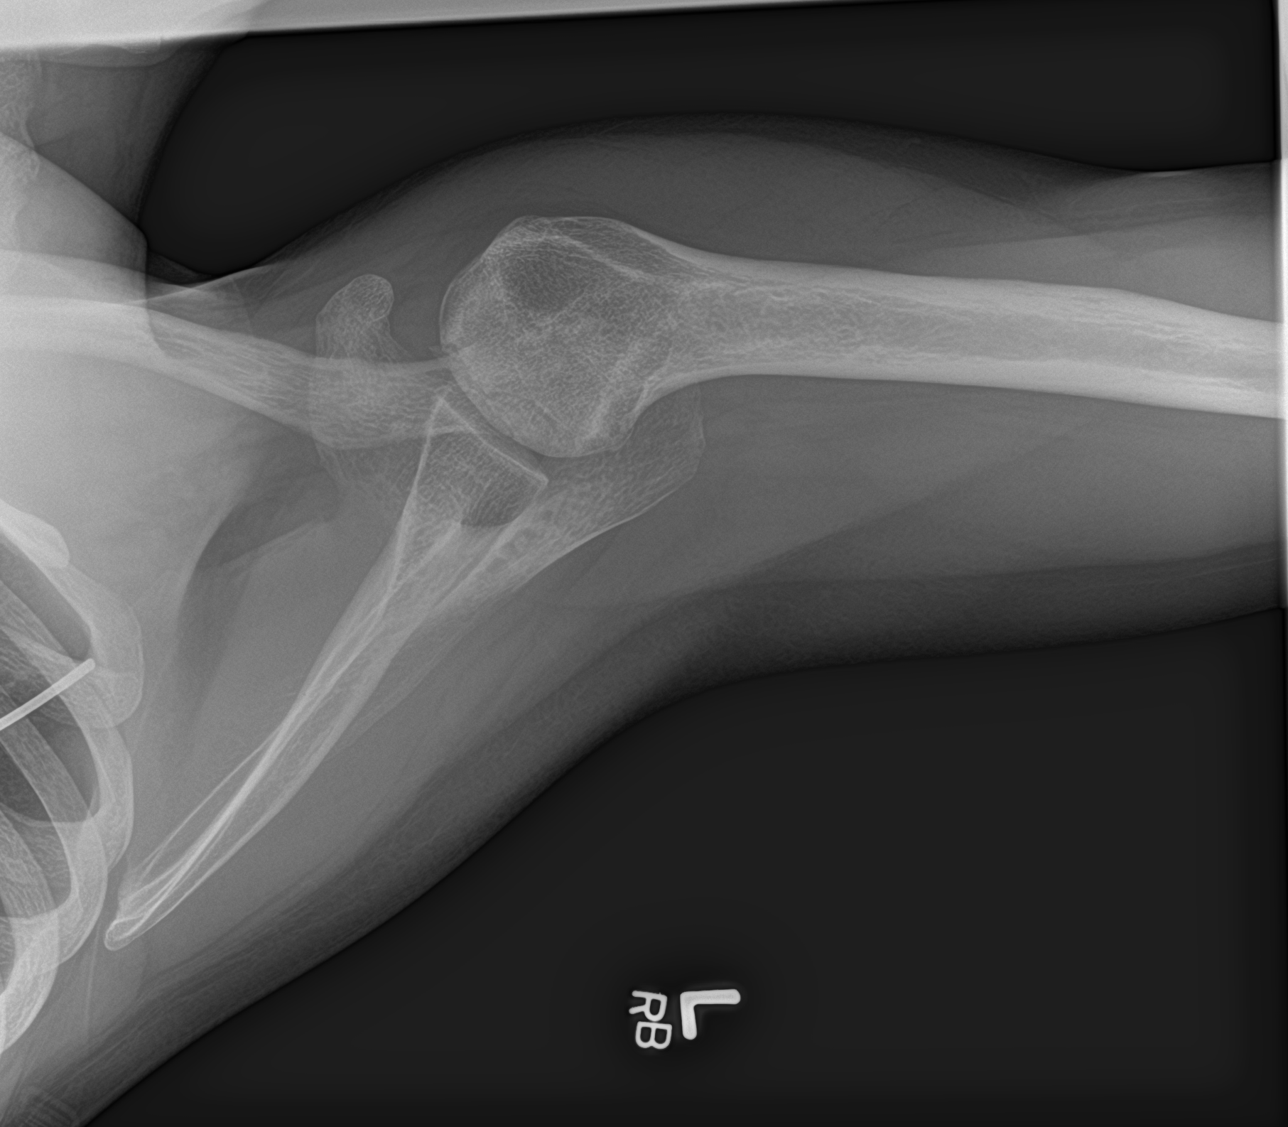

[3 of 3 positions shown; findings below may reference images not displayed]

FINDINGS: There is minimal cortical irregularity along the medial aspect of
the left humeral head, with linear subcortical lucency. This raises
suspicion for avascular necrosis and mild cortical collapse of the
left humeral head.

The left humeral head remains seated at the glenoid fossa. The
acromioclavicular joint is unremarkable in appearance. No
significant soft tissue abnormalities are seen. The visualized
portions of the left lung are clear.
IMPRESSION: Minimal cortical irregularity along the medial aspect of the left
humeral head, with linear subcortical lucency. This raises suspicion
for avascular necrosis and mild cortical collapse of the left
humeral head.
